# Patient Record
Sex: Male | Born: 1970 | Race: White | Hispanic: No | Marital: Married | Smoking: Former smoker
Health system: Southern US, Community
[De-identification: ages and names within clinical notes are randomized; demographics above are authoritative.]

## PROBLEM LIST (undated history)

## (undated) DIAGNOSIS — F410 Panic disorder [episodic paroxysmal anxiety] without agoraphobia: Secondary | ICD-10-CM

## (undated) DIAGNOSIS — C61 Malignant neoplasm of prostate: Secondary | ICD-10-CM

## (undated) DIAGNOSIS — K219 Gastro-esophageal reflux disease without esophagitis: Secondary | ICD-10-CM

## (undated) DIAGNOSIS — F419 Anxiety disorder, unspecified: Secondary | ICD-10-CM

## (undated) DIAGNOSIS — K759 Inflammatory liver disease, unspecified: Secondary | ICD-10-CM

## (undated) DIAGNOSIS — Z87442 Personal history of urinary calculi: Secondary | ICD-10-CM

## (undated) HISTORY — PX: PROSTATE BIOPSY: SHX241

## (undated) HISTORY — PX: LEG SURGERY: SHX1003

## (undated) HISTORY — PX: WISDOM TOOTH EXTRACTION: SHX21

---

## 2007-12-04 ENCOUNTER — Encounter: Admission: RE | Admit: 2007-12-04 | Discharge: 2007-12-04 | Payer: Self-pay | Admitting: Internal Medicine

## 2009-01-23 ENCOUNTER — Inpatient Hospital Stay (HOSPITAL_COMMUNITY): Admission: EM | Admit: 2009-01-23 | Discharge: 2009-01-26 | Payer: Self-pay | Admitting: Emergency Medicine

## 2009-02-18 ENCOUNTER — Encounter: Admission: RE | Admit: 2009-02-18 | Discharge: 2009-05-19 | Payer: Self-pay | Admitting: Orthopedic Surgery

## 2010-07-09 HISTORY — PX: FRACTURE SURGERY: SHX138

## 2010-10-15 LAB — BASIC METABOLIC PANEL
BUN: 15 mg/dL (ref 6–23)
BUN: 3 mg/dL — ABNORMAL LOW (ref 6–23)
Calcium: 8.3 mg/dL — ABNORMAL LOW (ref 8.4–10.5)
Calcium: 9.2 mg/dL (ref 8.4–10.5)
Chloride: 101 mEq/L (ref 96–112)
Chloride: 102 mEq/L (ref 96–112)
Chloride: 108 mEq/L (ref 96–112)
Creatinine, Ser: 0.66 mg/dL (ref 0.4–1.5)
GFR calc Af Amer: >60 mL/min (ref >60)
GFR calc Af Amer: >60 mL/min (ref >60)
GFR calc non Af Amer: >60 mL/min (ref >60)
GFR calc non Af Amer: >60 mL/min (ref >60)
Glucose, Bld: 109 mg/dL — ABNORMAL HIGH (ref 70–99)
Glucose, Bld: 114 mg/dL — ABNORMAL HIGH (ref 70–99)
Glucose, Bld: 95 mg/dL (ref 70–99)
Potassium: 3.6 mEq/L (ref 3.5–5.1)
Potassium: 3.7 mEq/L (ref 3.5–5.1)
Potassium: 3.9 mEq/L (ref 3.5–5.1)
Sodium: 134 mEq/L — ABNORMAL LOW (ref 135–145)
Sodium: 136 mEq/L (ref 135–145)

## 2010-10-15 LAB — DIFFERENTIAL
Basophils Relative: 1 % (ref 0–1)
Eosinophils Absolute: 0.4 10*3/uL (ref 0.0–0.7)
Monocytes Relative: 10 % (ref 3–12)
Neutro Abs: 5.5 10*3/uL (ref 1.7–7.7)
Neutrophils Relative %: 56 % (ref 43–77)

## 2010-10-15 LAB — PROTIME-INR
INR: 1 (ref 0.00–1.49)
Prothrombin Time: 12.9 seconds (ref 11.6–15.2)

## 2010-10-15 LAB — URINALYSIS, ROUTINE W REFLEX MICROSCOPIC
Bilirubin Urine: NEGATIVE
Glucose, UA: NEGATIVE mg/dL
Specific Gravity, Urine: 1.028 (ref 1.005–1.030)
pH: 5.5 (ref 5.0–8.0)

## 2010-10-15 LAB — CBC
HCT: 34.7 % — ABNORMAL LOW (ref 39.0–52.0)
HCT: 35.8 % — ABNORMAL LOW (ref 39.0–52.0)
Hemoglobin: 12.3 g/dL — ABNORMAL LOW (ref 13.0–17.0)
Hemoglobin: 13.1 g/dL (ref 13.0–17.0)
MCHC: 34.5 g/dL (ref 30.0–36.0)
MCV: 91.7 fL (ref 78.0–100.0)
MCV: 92.1 fL (ref 78.0–100.0)
MCV: 92.7 fL (ref 78.0–100.0)
Platelets: 192 10*3/uL (ref 150–400)
Platelets: 269 10*3/uL (ref 150–400)
RBC: 4.09 MIL/uL — ABNORMAL LOW (ref 4.22–5.81)
RBC: 4.93 MIL/uL (ref 4.22–5.81)
RDW: 12.9 % (ref 11.5–15.5)
RDW: 13.4 % (ref 11.5–15.5)
RDW: 13.5 % (ref 11.5–15.5)
WBC: 12.1 10*3/uL — ABNORMAL HIGH (ref 4.0–10.5)
WBC: 9.9 10*3/uL (ref 4.0–10.5)

## 2010-10-15 LAB — URINE MICROSCOPIC-ADD ON

## 2010-10-15 LAB — APTT: aPTT: 29 seconds (ref 24–37)

## 2010-11-21 NOTE — Discharge Summary (Signed)
NAMEABELINO, TIPPIN           ACCOUNT NO.:  [id number]   MEDICAL RECORD NO.:  [id number]          PATIENT TYPE:  INP   LOCATION:  5005                         FACILITY:  [facility]   PHYSICIAN:  Doralee Albino. Carola Frost, M.D. DATE OF BIRTH:  02/25/1971   DATE OF ADMISSION:  01/22/2009  DATE OF DISCHARGE:  01/26/2009                               DISCHARGE SUMMARY   DISCHARGE DIAGNOSES:  1. Right tibial shaft fracture, closed.  2. Weber B lateral malleolus fracture, closed.  3. Ruptured right syndesmosis.   ADDITIONAL DISCHARGE DIAGNOSIS:  Hyponatremia, resolved.   PROCEDURES PERFORMED ON January 23, 2009:  1. Intramedullary nailing of right tibia using DePuy 9 x 45 mm      statically locked nail.  2. Repair of right lateral malleolus fracture, open reduction and      internal fixation.  3. Repair of right syndesmotic disruption.  4. Stress x-rays evaluation under anesthesia of the syndesmosis for      external rotation stress view.   BRIEF HISTORY AND HOSPITAL COURSE:  Timothy [name] is a very pleasant 40-  year-old Caucasian male who was initially seen in the ED on January 23, 2009 after sustaining a severe injury to his right tibia fibula while  ice-skating.  The patient had immediate onset of pain and deformity and  was brought to [facility] for evaluation.  The evaluation  determined that he had multiple fractures of his right tibial shaft as  well as a right fibula fracture closed proximity to the lateral  malleolus with questionable injury to the syndesmosis.  The patient was  seen and evaluated in the emergency room by Orthopedics where it was  determined that he would indeed need a surgical intervention for his  injuries.  The patient was brought to the operating room same day for  surgery, which he tolerated very well.  In addition to fixation of the  tibial shaft, we did perform stress view of the right ankle after  completion of the IM nail, portion of the procedure,  and it was  determined that he did have an unstable syndesmosis, which required  fixation of the lateral malleolus as well as syndesmosis.  After  surgery, the patient was brought to the PACU for brief recovery from  anesthesia and was then transported to the Orthopedic floor for  continued observation and pain control.  Mr. [facility] hospital stay  was relatively uncomplicated.  Postoperative check was unremarkable.  He  did not exhibit any signs or symptoms of evolving compartment syndrome,  had excellent motor and sensory function at his right lower extremity as  well.  On postoperative day #1, the patient was doing very well and pain  was controlled on PCA pump.  He had a decreased appetite for solid food,  but was taking in fluids very well, voiding without any difficulty and  denied any numbness or tingling.  No other complaints were noted.  His  vital signs and labs were unremarkable.  Physical exam was unremarkable  as well.  Particularly at the right lower extremity, his splint was  intact.  Minimal edema of  the right leg.  Toes was intact.  No pain with  passive stretch or movement.  Sensory functions were intact of the right  lower extremity as well.  The patient continued to progress with  physical therapy, was mobilizing very well utilizing a walker.  We did  plan on discontinuing his PCA in the afternoon with hopeful discontinue  at home on the following day.  On postoperative day #1, the patient was  also started on Lovenox for DVT prophylaxis.  On postop day #2, the  patient stated that he did have a rougher night after his PCA was  discontinued and requested to stay another day to obtain better control  over his pain.  In addition, he had not navigate his stairs with  physical therapy and this was thought to be a reasonable request.  We  felt the patient was not ready for seek discharge to home.  No other  complaints were noted.  Vital signs continued to remained  stable.  He  did experience a mild drop of sodium to 134, H and H continued to remain  stable as well.  The patient physical exam was unremarkable as well,  need rest.  The dressing of his right knee was changed.  His incision  looked great.  We did maintain splint to the right ankle.  This will be  changed on follow up to the office.  Again, no pain with passive  stretch.  Sensory functions were intact distally.  Extremity was warm as  well.  The patient did mobilize very well with physical therapy on  postoperative day #2 and successfully navigate his stairs.  On postop  day #3, the patient was deemed stable for discharge to home without any  additional concerns.  Clinical encounter postop day #3, subjective,  objective, the patient is doing well and looked great.  He is ready to  go home.   PHYSICAL EXAMINATION:  VITAL SIGNS:  Temperature 99.4, heart rate 89,  respirations 18 and 96% on room air, BP is 111/60.  GENERAL:  The patient is comfortable in no acute distress.  LUNGS:  Clear to auscultation bilaterally.  CARDIAC:  S1 and S2 were noted.  ABDOMEN:  Positive bowel sounds, nontender and soft.  EXTREMITY:  Right lower extremity dressing is clean, dry, and intact.  Deep peroneal nerve, superficial peroneal nerve, and tibial nerve  sensory functions are intact.  EHL, FHL, motor functions are intact.  No  pain with passive stretch.  The patient is able to actively move his  digits and knee as well.  Extremity is warm.   Hemoglobin 11.9, hematocrit 34.7, white blood cells 10.9, platelets 192.  Sodium 139, potassium 3.9, chloride 102, bicarb 30, BUN 3, creatinine  0.64, glucose 95.   ASSESSMENT AND PLAN:  A 40 year old male with status post intramedullary  nail right hip shaft fracture and open reduction and internal fixation  of right lateral malleolus and syndesmosis.  1. Right tibia fibula fracture, ankle fractures, status post fixation.      The patient will continue to be  nonweightbearing on his right lower      extremity, should continue with PT and OT.  We will continue with      the splint until first office followup.  Continue with ice and      elevation help with swelling.  2. Pain.  Will continue with p.o. medication.  3. Fluids, electrolytes, and nutrition.  Continue with regular diet.  4. Deep venous  thrombosis prophylaxis.  Continue Lovenox x14 more      days.   DISPOSITION:  Discharge the patient to home today with home health  physical therapy.   DISCHARGE MEDICATIONS:  1. Percocet 10/325 one p.o. q.4-6 h. as needed for pain.  2. Oxycodone 5 mg one to two p.o. q.3 h. as needed for breakthrough      pain.  3. Robaxin 500 mg one to two p.o. q. 6 h. as needed.  4. Lovenox 40 mg one subcutaneous injection daily x14 more days.  The      patient is encouraged to use over-the-counter stool softeners as      needed.   ADDITIONAL MEDICATIONS:  The patient is on no home medications.   DISCHARGE INSTRUCTIONS AND PLAN:  Mr. Tavis did sustain a  substantial injury to his right lower extremity including in his tibia  and ankle despite the relatively low energy imparted to treat his  injury.  Mr. Laraia has done very well in the hospital, has  mobilized extraordinarily well with physical therapy and appears to be  fairly independent with his activities.  These all bowed well for a good  recovery as well.  Major concern regarding Mr. Granada history of  cigarette use and tobacco use.  As of at this point, he has not had a  cigarette since his injury and has had not desired in light of the  cigarette at this time.  We did review the various effects that nicotine  has on wound healing and the patient is well aware of these issues, in  case that he will refrain from use of any nicotine-containing products.  Mr. Villamor would be nonweightbearing for the next 8 weeks graduated  weightbearing thereafter.  We do anticipate seeing Mr.  Chew back  in the next 10-14 days, at which time, we will remove his splint and  allow him unrestricted range of motion of his ankle primarily in the  sagittal plane for the first 4-6 weeks.  Once complete healing of the  lateral malleolus is noted, we then will begin motion in the sagittal  plane.  Upon follow up, Mr. Weaver is encouraged to bring his CAM  boot as we will transition him into this once we removed the splint;  however, he will continue to be nonweightbearing until we have reached 8-  week mark postoperatively.  Mr. Kadlec is at risk for the  development of a DVT.  He will be on Lovenox for the next 14 days to be  help decrease his risk of the development in the blood clots.  I am hold  for that his activity level.  In the hospital, thus far it will be  indicative of what he does once he returned home and should mobilize  very well and be fairly active while maintaining nonweightbearing on his  right lower extremity.  Therefore, he should not require any additional  pharmacologic DVT prophylaxis; however, again his smoking history does  place somewhat at increased risk for clot development.  Again, we will  see Mr. Rauch back in the next 10-14 days for reevaluation, at  which time, we will assess his progress, obtain x-rays of his right  tibia and ankle as well as I will remove the sutures from his operative  wound.  Should the patient have any questions prior to followup, he was  encouraged to contact the office.      Mearl Latin, PA  Doralee Albino. Carola Frost, M.D.  Electronically Signed    KWP/MEDQ  D:  01/26/2009  T:  01/26/2009  Job:  [id number]

## 2010-11-21 NOTE — Op Note (Signed)
[name], Timothy [name]           ACCOUNT NO.:  [id number]   MEDICAL RECORD NO.:  [id number]          PATIENT TYPE:  INP   LOCATION:  5005                         FACILITY:  [facility]   PHYSICIAN:  Doralee Albino. Carola Frost, M.D. DATE OF BIRTH:  August 03, 1970   DATE OF PROCEDURE:  01/23/2009  DATE OF DISCHARGE:                               OPERATIVE REPORT   PREOPERATIVE DIAGNOSIS:  Right tibia and fibula fracture.   POSTOPERATIVE DIAGNOSES:  1. Right tibial shaft fracture.  2. Weber B lateral malleolus fracture.  3. Ruptured syndesmosis.   PROCEDURES:  1. Intramedullary nailing of the right tibia using a DePuy 9 x 345 mm      statically locked nail.  2. Repair of lateral malleolus fracture.  3. Repair of syndesmotic disruption.  4. Stress x-ray evaluation of the syndesmosis for external rotation      stress view.   SURGEON:  Doralee Albino. Carola Frost, MD   ASSISTANT:  Mearl Latin, PA   FINDINGS:  Syndesmotic instability with medial space widening.   ESTIMATED BLOOD LOSS:  100 mL or less.   DISPOSITION:  To PACU.   CONDITION:  Stable.   BRIEF SUMMARY OF INDICATIONS FOR PROCEDURE:  Timothy [name] is a 40-  year-old male sustained an ice-skating injury resulting in fracture to  the right tibia as well as a more distal fibula fracture.  He was  admitted for pain, taken to the operating room several hours later for  intramedullary nailing.  I discussed with the patient the risks and  benefits of surgery including the possibility of anterior knee pain,  symptomatic hardware, infection, malunion, nonunion, need for further  surgery, DVT, PE, loss of motion, infection, neurovascular injury,  compartment syndrome, and multiple others.  After full discussion, he  did wish to proceed.   BRIEF DESCRIPTION OF PROCEDURE:  Timothy [name] was taken to the  operating room where general anesthesia was induced.  His right hip was  bumped up, but the patient has bilateral external torsion of his  tibiae  and that we did intend to match the rotation being careful not to  internally rotate him because of his particular anatomy.  A 2-cm  incision was made at the distal extent of the patella with the knee  flexed.  A medial parapatellar approach was then made, a curved  cannulated awl placed on the correct starting point just medial to the  lateral tibial spine, and then advanced in the center-center position of  the proximal tibia.  Guidewire was then placed and advanced across the  fracture site into the center-center position of the tibial plafond.  I  took several reduction maneuvers for the displaced shaft, but I had  difficulty obtaining an anatomic reduction with initial maneuvers and  this included percutaneous placement of a tenaculum.  This was then  followed by gentle rotation of the tibia to allow the anterior cortex to  be below the proximal extent of the fracture, applied posterior force  there, and then de-rotated the tibia resulting in restoration of an  anatomic alignment.  This was checked on both AP and lateral images,  not  required any opening of the fracture site itself.  The fracture was held  reduced while reaming.  We encountered chatter at 8 and placed a 9 mm x  345 nail.  Two distal locks were placed using perfect circle technique.  Nail was gently back slapped to get complete apposition and  interdigitation of the fracture, which confirmed appropriate reduction  and rotation.  Two proximal static locks were then placed.  Their  position within the nail was confirmed on multiple C-arm images.  We  then irrigated and were beginning to close then, performed an external  rotation stress view under flow to make sure that there was no  syndesmotic stability and that separate fixation of the ankle fracture  components was not required.   On this stress ER view, clear medial widening was appreciable at the  within the tibiotalar joint.  This was consistent in  diagnostic of  syndesmotic instability.  I then attempted to percutaneously reduce the  syndesmosis by using a large pointed tenaculum through a stab incision  laterally and within the head of the screw or locking both medially.  This resulted in restoration of the syndesmosis but produced significant  displacement at the lateral malleolus, which was unacceptable because of  symptomatic nonunion risk that close to the ankle joint.  Consequently,  decision was made to proceed with a formal ORIF of the lateral malleolus  coupled with fixation of a reduced mortise and syndesmosis.   Standard lateral approach was made carefully looking for the superficial  peroneal nerve.  Soft tissues were retracted anteriorly.  The fracture  site was exposed while maintaining as much periosteal attachment as  possible.  The fibula appeared to be rotated appropriately, but we had  some medial-to-lateral translation which was difficult to control.  We  were able to do it with pointed tenaculums placing an anterior-posterior  lag screw.  This was followed by application of a 6-hole neutralization  plate using the DePuy small frag titanium implants.  Two screws were  placed distally, 2 in excellent cortical bone proximally, and then a  syndesmotic screw inferior to the nail across the plafond angle slightly  10-15 degrees anteriorly within the tibia.  The syndesmosis was held  reduced during placement.  External rotation stress view after placement  of this demonstrated no displacement or widening.  Wound was copiously  irrigated and closed in standard layered fashion with 2-0 Vicryl and 3-0  nylon.  Montez Morita, PA-C, assisted me throughout the procedures and was  required for all portions of the case including the nailing during which  he remained and place the implant while I held reduction, the external  rotation stress view during which he maintained the proximal tibia, and  also the fixation of the lateral  malleolus and syndesmosis during which  he held the reduction while I instrumented.  He also assisted with  simultaneous wound closure.   PROGNOSIS:  Timothy [name] has had a fairly substantial injury to his  tibia and ankle despite the relatively low magnitude of injury.  As  such, I am concerned about the possibility of a subsequent compartment  syndrome and he will be watched carefully over the next 24 hours to make  sure one does not develop.  He will be nonweightbearing for 8 weeks  approximately with gradual weightbearing thereafter.  He will be on DVT  prophylaxis with Lovenox.  I have unrestricted range of motion of the  knee once his wound  is healed.  We anticipate converting him into a Cam  boot and allowing for a ROM and PROM of the ankle as well.      Doralee Albino. Carola Frost, M.D.  Electronically Signed     MHH/MEDQ  D:  01/23/2009  T:  01/23/2009  Job:  [id number]

## 2011-08-29 DIAGNOSIS — F419 Anxiety disorder, unspecified: Secondary | ICD-10-CM | POA: Insufficient documentation

## 2015-10-25 ENCOUNTER — Emergency Department (HOSPITAL_BASED_OUTPATIENT_CLINIC_OR_DEPARTMENT_OTHER)
Admission: EM | Admit: 2015-10-25 | Discharge: 2015-10-25 | Disposition: A | Discharge status: Home / Self Care | Payer: BLUE CROSS/BLUE SHIELD | Attending: Emergency Medicine | Admitting: Emergency Medicine

## 2015-10-25 ENCOUNTER — Encounter (HOSPITAL_BASED_OUTPATIENT_CLINIC_OR_DEPARTMENT_OTHER): Payer: Self-pay | Admitting: Emergency Medicine

## 2015-10-25 ENCOUNTER — Emergency Department (HOSPITAL_BASED_OUTPATIENT_CLINIC_OR_DEPARTMENT_OTHER): Payer: BLUE CROSS/BLUE SHIELD

## 2015-10-25 DIAGNOSIS — R079 Chest pain, unspecified: Secondary | ICD-10-CM

## 2015-10-25 DIAGNOSIS — Z79899 Other long term (current) drug therapy: Secondary | ICD-10-CM | POA: Diagnosis not present

## 2015-10-25 DIAGNOSIS — Z8659 Personal history of other mental and behavioral disorders: Secondary | ICD-10-CM | POA: Insufficient documentation

## 2015-10-25 DIAGNOSIS — R0789 Other chest pain: Secondary | ICD-10-CM

## 2015-10-25 HISTORY — DX: Anxiety disorder, unspecified: F41.9

## 2015-10-25 HISTORY — DX: Panic disorder (episodic paroxysmal anxiety): F41.0

## 2015-10-25 LAB — CBC
HEMATOCRIT: 46.3 % (ref 39.0–52.0)
HEMOGLOBIN: 16.1 g/dL (ref 13.0–17.0)
MCH: 31.2 pg (ref 26.0–34.0)
MCHC: 34.8 g/dL (ref 30.0–36.0)
MCV: 89.7 fL (ref 78.0–100.0)
Platelets: 257 10*3/uL (ref 150–400)
RBC: 5.16 MIL/uL (ref 4.22–5.81)
RDW: 12.5 % (ref 11.5–15.5)
WBC: 6.8 10*3/uL (ref 4.0–10.5)

## 2015-10-25 LAB — LIPASE, BLOOD: Lipase: 16 U/L (ref 11–51)

## 2015-10-25 LAB — D-DIMER, QUANTITATIVE (NOT AT ARMC): D DIMER QUANT: 0.48 ug{FEU}/mL (ref 0.00–0.50)

## 2015-10-25 LAB — BASIC METABOLIC PANEL
ANION GAP: 7 (ref 5–15)
BUN: 13 mg/dL (ref 6–20)
CHLORIDE: 111 mmol/L (ref 101–111)
CO2: 22 mmol/L (ref 22–32)
Calcium: 9 mg/dL (ref 8.9–10.3)
Creatinine, Ser: 0.67 mg/dL (ref 0.61–1.24)
GFR calc Af Amer: >60 mL/min (ref >60)
GLUCOSE: 119 mg/dL — AB (ref 65–99)
POTASSIUM: 3.3 mmol/L — AB (ref 3.5–5.1)
Sodium: 140 mmol/L (ref 135–145)

## 2015-10-25 LAB — TROPONIN I: Troponin I: <0.03 ng/mL (ref <0.031)

## 2015-10-25 MED ORDER — ASPIRIN 81 MG PO CHEW
324.0000 mg | CHEWABLE_TABLET | Freq: Once | ORAL | Status: AC
  Administered 2015-10-25: 324 mg via ORAL
  Filled 2015-10-25: qty 4

## 2015-10-25 MED ORDER — GI COCKTAIL ~~LOC~~
30.0000 mL | Freq: Once | ORAL | Status: AC
  Administered 2015-10-25: 30 mL via ORAL
  Filled 2015-10-25: qty 30

## 2015-10-25 MED ORDER — FAMOTIDINE 20 MG PO TABS
20.0000 mg | ORAL_TABLET | Freq: Two times a day (BID) | ORAL | Status: DC
Start: 2015-10-25 — End: 2020-09-02

## 2015-10-25 MED FILL — HEARTBURN RELIEF TABLET: 10 | 15 days supply | Qty: 60 | Fill #0

## 2015-10-25 NOTE — ED Provider Notes (Signed)
CSN: [id number]     Arrival date & time 10/25/15  [id number] History   First MD Initiated Contact with Patient 10/25/15 1006     Chief Complaint  Patient presents with  . Chest Pain     (Consider location/radiation/quality/duration/timing/severity/associated sxs/prior Treatment) HPI Comments: PT NOTICED CP INITIALLY ON 4/14.  IT WAS SHARP AND BRIEF.  IT CAME BACK BRIEFLY AGAIN YESTERDAY AND TODAY.  PT SAID THAT HE WAS VERY ACTIVE THIS WEEKEND AND EXERCISE DID NOT WORSEN SX.  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  Patient is a 45 y.o. male presenting with chest pain. The history is provided by the patient.  Chest Pain Pain location:  Substernal area Pain quality: dull   Pain radiates to:  Does not radiate Pain radiates to the back: no   Pain severity:  Mild Onset quality:  Sudden Timing:  Intermittent Progression:  Waxing and waning Chronicity:  Recurrent Context: at rest   Relieved by:  None tried Worsened by:  Nothing tried Ineffective treatments:  None tried Risk factors: male sex and smoking     Past Medical History  Diagnosis Date  . Anxiety   . Panic attack    Past Surgical History  Procedure Laterality Date  . Leg surgery     No family history on file.  PT IS ADOPTED.  SOC HX:  PT QUIT SMOKING 3 YEARS AGO.  HE DRINKS OCC.  NO DOA.  Review of Systems  Cardiovascular: Positive for chest pain.  All other systems reviewed and are negative.     Allergies  Percocet  Home Medications   Prior to Admission medications   Medication Sig Start Date End Date Taking? Authorizing Provider  Multiple Vitamin (MULTIVITAMIN) capsule Take 1 capsule by mouth daily.   Yes Historical Provider, MD   BP 165/99 mmHg  Pulse 96  Temp(Src) 98.3 F (36.8 C) (Oral)  Resp 20  Ht 5\' 7"  (1.702 m)  Wt 220 lb (99.791 kg)  BMI 34.45 kg/m2  SpO2 100% Physical Exam  ED Course  Procedures (including critical care time) Labs Review Labs Reviewed  BASIC METABOLIC PANEL   CBC  TROPONIN I  D-DIMER, QUANTITATIVE (NOT AT [facility])  LIPASE, BLOOD    Imaging Review No results found. I have personally reviewed and evaluated these images and lab results as part of my medical decision-making.   EKG Interpretation None    NL SINUS RHYTHM HR 94.  NO ST/T WAVE CHANGES.  NO STEMI.  MDM  Pt has minimal risk for cad.  I think he can go home and return if worse.  dx:  Nonspecific chest pain    Isla Pence, MD 10/25/15 1125

## 2015-10-25 NOTE — ED Notes (Signed)
Intermittent centralized chest pain since last Thursday.  Some diaphoresis.  Pt also has some anxiety.

## 2015-11-11 DIAGNOSIS — K219 Gastro-esophageal reflux disease without esophagitis: Secondary | ICD-10-CM | POA: Insufficient documentation

## 2016-01-06 DIAGNOSIS — F32A Depression, unspecified: Secondary | ICD-10-CM | POA: Insufficient documentation

## 2016-01-20 DIAGNOSIS — G4700 Insomnia, unspecified: Secondary | ICD-10-CM | POA: Insufficient documentation

## 2017-07-19 DIAGNOSIS — E669 Obesity, unspecified: Secondary | ICD-10-CM | POA: Insufficient documentation

## 2020-05-13 DIAGNOSIS — C61 Malignant neoplasm of prostate: Secondary | ICD-10-CM | POA: Insufficient documentation

## 2020-08-08 ENCOUNTER — Other Ambulatory Visit: Payer: Self-pay | Admitting: Urology

## 2020-08-08 DIAGNOSIS — C61 Malignant neoplasm of prostate: Secondary | ICD-10-CM

## 2020-08-17 ENCOUNTER — Encounter (HOSPITAL_COMMUNITY): Payer: Self-pay

## 2020-08-17 ENCOUNTER — Encounter (HOSPITAL_COMMUNITY): Payer: 59

## 2020-08-17 ENCOUNTER — Encounter (HOSPITAL_COMMUNITY): Admission: RE | Admit: 2020-08-17 | Payer: 59 | Source: Ambulatory Visit

## 2020-08-17 ENCOUNTER — Ambulatory Visit (HOSPITAL_COMMUNITY): Payer: 59

## 2020-08-24 ENCOUNTER — Other Ambulatory Visit (HOSPITAL_COMMUNITY): Payer: 59

## 2020-08-26 ENCOUNTER — Encounter (HOSPITAL_COMMUNITY)
Admission: RE | Admit: 2020-08-26 | Discharge: 2020-08-26 | Disposition: A | Discharge status: Home / Self Care | Payer: 59 | Source: Ambulatory Visit | Attending: Urology | Admitting: Urology

## 2020-08-26 ENCOUNTER — Other Ambulatory Visit: Payer: Self-pay

## 2020-08-26 DIAGNOSIS — C61 Malignant neoplasm of prostate: Secondary | ICD-10-CM | POA: Insufficient documentation

## 2020-08-26 MED ORDER — TECHNETIUM TC 99M MEDRONATE IV KIT
20.0000 | PACK | Freq: Once | INTRAVENOUS | Status: AC | PRN
  Administered 2020-08-26: 20.9 via INTRAVENOUS

## 2020-08-30 ENCOUNTER — Encounter: Payer: Self-pay | Admitting: Radiation Oncology

## 2020-08-30 NOTE — Progress Notes (Signed)
GU Location of Tumor / Histology: prostatic adenocarcinoma  If Prostate Cancer, Gleason Score is (4 + 4) and PSA is (5.1). Prostate volume: 17 grams  Timothy [name] presented with an elevated PSA after initial PSA check. Patient explains he had a routine physical on January 28,2021. Bloodwork from this physical revealed an elevated PSA. Unfortunately, repeat PSA 3 months later was also elevated thus patient was referred to Dr. Abner Greenspan.  Biopsies of prostate (if applicable) revealed:   Past/Anticipated interventions by urology, if any: prostate biopsy, CT abd/pelvis, bone scan (negative), referral to Dr. Tammi Klippel to discuss radiation options.  Patient leaning toward RALP.  Past/Anticipated interventions by medical oncology, if any: no  Weight changes, if any: denies  Bowel/Bladder complaints, if any: IPSS 2 SHIM 25. Denies dysuria, hematuria, urinary leakage or incontinence. Denies any bowel complaints.   Nausea/Vomiting, if any: denies  Pain issues, if any:  Chronic low back pain right side worse than left. Prolonged standing creates tingling in his right foot.  SAFETY ISSUES:  Prior radiation? denies  Pacemaker/ICD? denies  Possible current pregnancy? no, male patient  Is the patient on methotrexate? no  Current Complaints / other details:  50 year old male. Married with 1 son. Resides in [place]. Patient adopted.

## 2020-09-02 ENCOUNTER — Encounter: Payer: Self-pay | Admitting: Medical Oncology

## 2020-09-02 ENCOUNTER — Ambulatory Visit
Admission: RE | Admit: 2020-09-02 | Discharge: 2020-09-02 | Disposition: A | Discharge status: Home / Self Care | Payer: 59 | Source: Ambulatory Visit | Attending: Radiation Oncology | Admitting: Radiation Oncology

## 2020-09-02 ENCOUNTER — Encounter: Payer: Self-pay | Admitting: Radiation Oncology

## 2020-09-02 ENCOUNTER — Other Ambulatory Visit: Payer: Self-pay

## 2020-09-02 VITALS — BP 118/66 | HR 73 | Temp 97.6°F | Resp 20 | Ht 66.0 in | Wt 214.4 lb

## 2020-09-02 DIAGNOSIS — Z79899 Other long term (current) drug therapy: Secondary | ICD-10-CM | POA: Insufficient documentation

## 2020-09-02 DIAGNOSIS — C61 Malignant neoplasm of prostate: Secondary | ICD-10-CM

## 2020-09-02 DIAGNOSIS — F419 Anxiety disorder, unspecified: Secondary | ICD-10-CM | POA: Insufficient documentation

## 2020-09-02 HISTORY — DX: Malignant neoplasm of prostate: C61

## 2020-09-02 NOTE — Progress Notes (Signed)
Radiation Oncology         [phone] ________________________________  Initial outpatient Consultation  Name: Timothy [name] MRN: [id number]  Date: 09/02/2020  DOB: 05/08/1971  CC:Loyola Mast, PA-C  Janith Lima, MD   REFERRING PHYSICIAN: Janith Lima, MD  DIAGNOSIS: 50 y.o. gentleman with Stage T1c adenocarcinoma of the prostate with Gleason score of 4+4, and PSA of 5.1.    ICD-10-CM   1. Malignant neoplasm of prostate [facility])  [place] Ambulatory Referral to Genetics    HISTORY OF PRESENT ILLNESS: Timothy [name] is a 50 y.o. male with a diagnosis of prostate cancer. He was noted to have an elevated PSA of 4.8 in 01/2020 by his primary care provider, Daisy Lazar, PA-C.  A repeat PSA approximately 3 months later, on 04/08/2020, was further elevated at 5.1.  Accordingly, he was referred for evaluation in urology by Dr. Abner Greenspan on 06/10/20,  digital rectal examination was performed at that time revealing no nodules.  The patient proceeded to transrectal ultrasound with 12 biopsies of the prostate on 07/28/20.  The prostate volume measured 17 cc.  Out of 12 core biopsies, 7 were positive.  The maximum Gleason score was 4+4, and this was seen in the right mid lateral, right base lateral, right mid (with PNI). Additionally, Gleason 4+3 was seen in the right base, left mid, and left mid lateral, and Gleason 3+4 in left base.  He proceeded to disease staging imaging with CT and bone scan on 08/26/2020. CT A/P showed minimal heterogeneity of the prostate gland without evidence of extracapsular tumor or metastatic disease. Bone scan was also negative for osseous metastatic disease.  The patient reviewed the biopsy results with his urologist and he has kindly been referred today for discussion of potential radiation treatment options.  PREVIOUS RADIATION THERAPY: No  PAST MEDICAL HISTORY:  Past Medical History:  Diagnosis Date  . Anxiety   . Panic attack   . Prostate cancer  ([place])       PAST SURGICAL HISTORY: Past Surgical History:  Procedure Laterality Date  . LEG SURGERY    . PROSTATE BIOPSY      FAMILY HISTORY:  Family History  Adopted: Yes  Problem Relation Age of Onset  . Breast cancer Neg Hx   . Colon cancer Neg Hx   . Pancreatic cancer Neg Hx   . Prostate cancer Neg Hx     SOCIAL HISTORY:  Social History   Socioeconomic History  . Marital status: Married    Spouse name: Not on file  . Number of children: Not on file  . Years of education: Not on file  . Highest education level: Not on file  Occupational History  . Not on file  Tobacco Use  . Smoking status: Never Smoker  . Smokeless tobacco: Never Used  Vaping Use  . Vaping Use: Never used  Substance and Sexual Activity  . Alcohol use: Not Currently  . Drug use: Never  . Sexual activity: Yes  Other Topics Concern  . Not on file  Social History Narrative  . Not on file   Social Determinants of Health   Financial Resource Strain: Not on file  Food Insecurity: Not on file  Transportation Needs: Not on file  Physical Activity: Not on file  Stress: Not on file  Social Connections: Not on file  Intimate Partner Violence: Not on file    ALLERGIES: Other, Percocet [oxycodone-acetaminophen], Tetanus toxoids, and Shellfish-derived products  MEDICATIONS:  Current Outpatient Medications  Medication Sig Dispense Refill  . busPIRone (BUSPAR) 10 MG tablet Take 10 mg by mouth 2 (two) times daily.    [name] escitalopram (LEXAPRO) 20 MG tablet Take 20 mg by mouth daily.    . Multiple Vitamin (MULTIVITAMIN) capsule Take 1 capsule by mouth daily.     No current facility-administered medications for this encounter.    REVIEW OF SYSTEMS:  On review of systems, the patient reports that he is doing well overall. He denies any chest pain, shortness of breath, cough, fevers, chills, night sweats, unintended weight changes. He denies any bowel disturbances, and denies abdominal pain, nausea or  vomiting. He denies any new musculoskeletal or joint aches or pains. His IPSS was 2, indicating mild urinary symptoms. His SHIM was 25, indicating he does not have erectile dysfunction. A complete review of systems is obtained and is otherwise negative.    PHYSICAL EXAM:  Wt Readings from Last 3 Encounters:  09/02/20 214 lb 6.4 oz (97.3 kg)  10/25/15 220 lb (99.8 kg)   Temp Readings from Last 3 Encounters:  09/02/20 97.6 F (36.4 C)  10/25/15 98.3 F (36.8 C) (Oral)   BP Readings from Last 3 Encounters:  09/02/20 118/66  10/25/15 115/69   Pulse Readings from Last 3 Encounters:  09/02/20 73  10/25/15 72   Pain Assessment Pain Score: 0-No pain (Denies new pain. Reports chronic low back pain right worse than left.)/10  In general this is a well appearing Caucasian male in no acute distress. He's alert and oriented x4 and appropriate throughout the examination. Cardiopulmonary assessment is negative for acute distress, and he exhibits normal effort.     KPS = 100  100 - Normal; no complaints; no evidence of disease. 90   - Able to carry on normal activity; minor signs or symptoms of disease. 80   - Normal activity with effort; some signs or symptoms of disease. 64   - Cares for self; unable to carry on normal activity or to do active work. 60   - Requires occasional assistance, but is able to care for most of his personal needs. 50   - Requires considerable assistance and frequent medical care. 37   - Disabled; requires special care and assistance. 2   - Severely disabled; hospital admission is indicated although death not imminent. 27   - Very sick; hospital admission necessary; active supportive treatment necessary. 10   - Moribund; fatal processes progressing rapidly. 0     - Dead  Karnofsky DA, Abelmann Stem, Craver LS and Burchenal [facility] [phone]) The use of the nitrogen mustards in the palliative treatment of carcinoma: with particular reference to bronchogenic carcinoma Cancer 1  634-56  LABORATORY DATA:  Lab Results  Component Value Date   WBC 6.8 10/25/2015   HGB 16.1 10/25/2015   HCT 46.3 10/25/2015   MCV 89.7 10/25/2015   PLT 257 10/25/2015   Lab Results  Component Value Date   NA 140 10/25/2015   K 3.3 (L) 10/25/2015   CL 111 10/25/2015   CO2 22 10/25/2015   No results found for: ALT, AST, GGT, ALKPHOS, BILITOT   RADIOGRAPHY: NM Bone Scan Whole Body  Result Date: 08/28/2020 CLINICAL DATA:  Prostate cancer. EXAM: NUCLEAR MEDICINE WHOLE BODY BONE SCAN TECHNIQUE: Whole body anterior and posterior images were obtained approximately 3 hours after intravenous injection of radiopharmaceutical. RADIOPHARMACEUTICALS:  20.9 mCi Technetium-34m MDP IV COMPARISON:  Abdominopelvic CT 08/17/2020 FINDINGS: Physiologic distribution of radiotracer uptake throughout the axial and appendicular skeleton.  There is mild degenerative uptake at the lumbosacral junction. Degenerative changes are seen on CT. No focal uptake to suggest osseous metastatic disease. Both kidneys and bladder are visualized. IMPRESSION: No scintigraphic findings to suggest osseous metastatic disease. Electronically Signed   By: Keith Rake M.D.   On: 08/28/2020 10:32      IMPRESSION/PLAN: 1. 50 y.o. gentleman with Stage T1c adenocarcinoma of the prostate with Gleason Score of 4+4, and PSA of 5.1. We discussed the patient's workup and outlined the nature of prostate cancer in this setting. The patient's T stage, Gleason's score, and PSA put him into the high risk group. Accordingly, he is eligible for a variety of potential treatment options including prostatectomy or LT-ADT in combination with either 8 weeks of external radiation or 5 weeks of external radiation with an upfront brachytherapy boost. We discussed the available radiation techniques, and focused on the details and logistics of delivery. We discussed and outlined the risks, benefits, short and long-term effects associated with radiotherapy  and compared and contrasted these with prostatectomy. We discussed the role of SpaceOAR gel in reducing the rectal toxicity associated with radiotherapy. We also detailed the role of ADT in the treatment of high risk prostate cancer and outlined the associated side effects that could be expected with this therapy.  He and his wife were encouraged to ask questions are answered to their stated satisfaction.  The patient focused most of his questions and interest in robotic-assisted laparoscopic radical prostatectomy.  We discussed some of the potential advantages of surgery including surgical staging, the availability of salvage radiotherapy to the prostatic fossa, and the confidence associated with immediate biochemical response. We discussed some of the potential proven indications for postoperative radiotherapy including positive margins, extracapsular extension, and seminal vesicle involvement. We also talked about some of the other potential findings leading to a recommendation for radiotherapy including a non-zero postoperative PSA and positive lymph nodes. He appears to have a good understanding of his disease and our treatment recommendations which are of curative intent.   At the conclusion of our conversation, the patient is interested in moving forward with prostatectomy. We will share our discussion with Dr. Abner Greenspan so that he can proceed with coordinating surgery. We enjoyed meeting him and his wife today and look forward to following along in his progress.  2. High Risk prostate cancer diagnosed at young age.  The patient was counseled regarding the possibility of a familial predisposition for cancers though he does not know anything about his natural parents medical history since he is adopted.  After further discussion regarding the potential benefits to himself, his children and future generations, he is interested in proceeding with genetic counseling/testing. A referral will be made to one of our  genetic counselors here in the cancer center for further assessment.    Nicholos Johns, PA-C    Tyler Pita, MD  [facility] Direct Dial: [phone]  Fax: [phone] .com  Skype  LinkedIn   This document serves as a record of services personally performed by Tyler Pita, MD and Freeman Caldron, PA-C. It was created on their behalf by Wilburn Mylar, a trained medical scribe. The creation of this record is based on the scribe's personal observations and the provider's statements to them. This document has been checked and approved by the attending provider.

## 2020-09-02 NOTE — Progress Notes (Signed)
Introduced myself to patient and his wife as the prostate nurse navigator and discussed my role. He states he is most interest in surgery due to his age. He is adopted with no family history. He is interested in genetic testing for his son's sake. No barriers to care at this time. He has follow up with Dr. Abner Greenspan 3/17 to discuss surgery further. I gave them my business card and asked them to call if I can assist them in any way. They voiced understanding and I wished him well.

## 2020-09-05 ENCOUNTER — Telehealth: Payer: Self-pay | Admitting: Genetic Counselor

## 2020-09-05 NOTE — Telephone Encounter (Signed)
Received a genetic counseling referral from radonc for prostate cancer. Timothy [name] has been cld and scheduled to see Santiago Glad for an in person visit on 3/7 at [place].

## 2020-09-08 ENCOUNTER — Other Ambulatory Visit: Payer: Self-pay | Admitting: Urology

## 2020-09-09 ENCOUNTER — Other Ambulatory Visit: Payer: Self-pay | Admitting: Licensed Clinical Social Worker

## 2020-09-09 DIAGNOSIS — C61 Malignant neoplasm of prostate: Secondary | ICD-10-CM

## 2020-09-12 ENCOUNTER — Other Ambulatory Visit: Payer: Self-pay

## 2020-09-12 ENCOUNTER — Inpatient Hospital Stay: Payer: 59 | Attending: Genetic Counselor | Admitting: Licensed Clinical Social Worker

## 2020-09-12 ENCOUNTER — Inpatient Hospital Stay: Payer: 59

## 2020-09-12 DIAGNOSIS — C61 Malignant neoplasm of prostate: Secondary | ICD-10-CM

## 2020-09-12 LAB — GENETIC SCREENING ORDER

## 2020-09-12 NOTE — Progress Notes (Signed)
REFERRING PROVIDER: Freeman Caldron, PA-C [place],  [place] [address]  PRIMARY PROVIDER:  Loyola Mast, PA-C  PRIMARY REASON FOR VISIT:  1. Malignant neoplasm of prostate ([place])      HISTORY OF PRESENT ILLNESS:   Mr. Nay, a 50 y.o. male, was seen for a [facility] cancer genetics consultation at the request of Dr. Vincent Gros due to a personal history of cancer and unknown family history.  Mr. Purohit presents to clinic today to discuss the possibility of a hereditary predisposition to cancer, genetic testing, and to further clarify his future cancer risks, as well as potential cancer risks for family members.   In 2022, at the age of 8, Mr. Mccabe was diagnosed with prostate cancer, Stage IIC, Grade Group: 4.  Prostatectomy is planned 10/24/20. Patient had negative Cologuard, has not had colonoscopy.   CANCER HISTORY:  Oncology History  Malignant neoplasm of prostate ([place])  05/13/2020 Initial Diagnosis   Prostate cancer ([place])   07/28/2020 Cancer Staging   Staging form: Prostate, AJCC 8th Edition - Clinical stage from 07/28/2020: Stage IIC (cT1c, cN0, cM0, PSA: 5.1, Grade Group: 4) - Signed by Freeman Caldron, PA-C on 09/02/2020 Histopathologic type: Adenocarcinoma, NOS Stage prefix: Initial diagnosis Prostate specific antigen (PSA) range: Less than 10 Gleason primary pattern: 4 Gleason secondary pattern: 4 Gleason score: 8 Histologic grading system: 5 grade system Number of biopsy cores examined: 12 Number of biopsy cores positive: 7 Location of positive needle core biopsies: Both sides      Past Medical History:  Diagnosis Date  . Anxiety   . Panic attack   . Prostate cancer [facility])     Past Surgical History:  Procedure Laterality Date  . LEG SURGERY    . PROSTATE BIOPSY      Social History   Socioeconomic History  . Marital status: Married    Spouse name: Not on file  . Number of children: Not on file  . Years of education: Not on file   . Highest education level: Not on file  Occupational History  . Not on file  Tobacco Use  . Smoking status: Never Smoker  . Smokeless tobacco: Never Used  Vaping Use  . Vaping Use: Never used  Substance and Sexual Activity  . Alcohol use: Not Currently  . Drug use: Never  . Sexual activity: Yes  Other Topics Concern  . Not on file  Social History Narrative  . Not on file   Social Determinants of Health   Financial Resource Strain: Not on file  Food Insecurity: Not on file  Transportation Needs: Not on file  Physical Activity: Not on file  Stress: Not on file  Social Connections: Not on file     FAMILY HISTORY:  We obtained a detailed, 4-generation family history.  Significant diagnoses are listed below: Family History  Adopted: Yes  Problem Relation Age of Onset  . Breast cancer Neg Hx   . Colon cancer Neg Hx   . Pancreatic cancer Neg Hx   . Prostate cancer Neg Hx    Mr. Corporan was adopted and does not have any knowledge of family history.   Mr. Mcnerney is unaware of previous family history of genetic testing for hereditary cancer risks. Patient's maternal ancestors are of Polish/[place]ian descent, and paternal ancestors are of unknown descent. There is no reported Ashkenazi Jewish ancestry. There is no known consanguinity.  GENETIC COUNSELING ASSESSMENT: Mr. Chavarin is a 50 y.o. male with a personal history of prostate  cancer which is somewhat suggestive of a hereditary cancer syndrome and predisposition to cancer. We, therefore, discussed and recommended the following at today's visit.   DISCUSSION: We discussed that approximately 5-10% of prostate cancer is hereditary  Most cases of hereditary prostate cancer are associated with BRCA1/BRCA2 genes, although there are other genes associated with hereditary prostate cancer as well as genes related to other types of cancer such as breast, colon, etc. We discussed that testing is beneficial for several reasons  including  knowing about other cancer risks, identifying potential screening and risk-reduction options that may be appropriate, and to understand if other family members could be at risk for cancer and allow them to undergo genetic testing.   We reviewed the characteristics, features and inheritance patterns of hereditary cancer syndromes. We also discussed genetic testing, including the appropriate family members to test, the process of testing, insurance coverage and turn-around-time for results. We discussed the implications of a negative, positive and/or variant of uncertain significant result. We recommended Mr. Inclan pursue genetic testing for the [facility] CancerNext-Expanded+RNA gene panel.   The CancerNext-Expanded + RNAinsight gene panel offered by [employer] and includes sequencing and rearrangement analysis for the following 77 genes: IP, ALK, APC*, ATM*, AXIN2, BAP1, BARD1, BLM, BMPR1A, BRCA1*, BRCA2*, BRIP1*, CDC73, CDH1*,CDK4, CDKN1B, CDKN2A, CHEK2*, CTNNA1, DICER1, FANCC, FH, FLCN, GALNT12, KIF1B, LZTR1, MAX, MEN1, MET, MLH1*, MSH2*, MSH3, MSH6*, MUTYH*, NBN, NF1*, NF2, NTHL1, PALB2*, PHOX2B, PMS2*, POT1, PRKAR1A, PTCH1, PTEN*, RAD51C*, RAD51D*,RB1, RECQL, RET, SDHA, SDHAF2, SDHB, SDHC, SDHD, SMAD4, SMARCA4, SMARCB1, SMARCE1, STK11, SUFU, TMEM127, TP53*,TSC1, TSC2, VHL and XRCC2 (sequencing and deletion/duplication); EGFR, EGLN1, HOXB13, KIT, MITF, PDGFRA, POLD1 and POLE (sequencing only); EPCAM and GREM1 (deletion/duplication only).   Based on Mr. Oliff personal history of cancer, he meets medical criteria for genetic testing. Despite that he meets criteria, he may still have an out of pocket cost. We discussed that if his out of pocket cost for testing is over $100, the laboratory will call and confirm whether he wants to proceed with testing.  If the out of pocket cost of testing is less than $100 he will be billed by the genetic testing laboratory.   PLAN: After considering  the risks, benefits, and limitations, Mr. Gille provided informed consent to pursue genetic testing and the blood sample was sent to [facility] for analysis of the CancerNext-Expanded+RNA panel. Results should be available within approximately 2-3 weeks' time, at which point they will be disclosed by telephone to Mr. Zechman, as will any additional recommendations warranted by these results. Mr. Saleeby will receive a summary of his genetic counseling visit and a copy of his results once available. This information will also be available in Epic.   Mr. Mcminn questions were answered to his satisfaction today. Our contact information was provided should additional questions or concerns arise. Thank you for the referral and allowing [place] to share in the care of your patient.   Faith Rogue, MS, [facility] Genetic Counselor [email or web address] Phone: [phone]  The patient was seen for a total of 35 minutes in face-to-face genetic counseling.  Patient's wife, Bedelia Person and [facility] intern Magda Paganini were also present. Drs. Magrinat, Lindi Adie and/or Burr Medico were available for discussion regarding this case.   _______________________________________________________________________ For Office Staff:  Number of people involved in session: 3 Was an Intern/ student involved with case: yes

## 2020-09-28 ENCOUNTER — Telehealth: Payer: Self-pay | Admitting: Licensed Clinical Social Worker

## 2020-09-28 NOTE — Telephone Encounter (Signed)
Revealed negative genetic testing.  This normal result is reassuring and indicates that it is unlikely Timothy [name] cancer is due to a hereditary cause.  It is unlikely that there is an increased risk of another cancer due to a mutation in one of these genes.  However, genetic testing is not perfect, and cannot definitively rule out a hereditary cause.  It will be important for him to keep in contact with genetics to learn if any additional testing may be needed in the future.

## 2020-09-29 ENCOUNTER — Encounter: Payer: Self-pay | Admitting: Licensed Clinical Social Worker

## 2020-09-29 ENCOUNTER — Ambulatory Visit: Payer: Self-pay | Admitting: Licensed Clinical Social Worker

## 2020-09-29 DIAGNOSIS — Z1379 Encounter for other screening for genetic and chromosomal anomalies: Secondary | ICD-10-CM

## 2020-09-29 DIAGNOSIS — C61 Malignant neoplasm of prostate: Secondary | ICD-10-CM

## 2020-09-29 NOTE — Progress Notes (Signed)
HPI:  [name] was previously seen in the [facility] due to a personal history of prostate cancer and concerns regarding a hereditary predisposition to cancer. Please refer to our prior cancer genetics clinic note for more information regarding our discussion, assessment and recommendations, at the time. [name] recent genetic test results were disclosed to him, as were recommendations warranted by these results. These results and recommendations are discussed in more detail below.  CANCER HISTORY:  Oncology History  Malignant neoplasm of prostate ([place])  05/13/2020 Initial Diagnosis   Prostate cancer ([place])   07/28/2020 Cancer Staging   Staging form: Prostate, AJCC 8th Edition - Clinical stage from 07/28/2020: Stage IIC (cT1c, cN0, cM0, PSA: 5.1, Grade Group: 4) - Signed by Freeman Caldron, PA-C on 09/02/2020 Histopathologic type: Adenocarcinoma, NOS Stage prefix: Initial diagnosis Prostate specific antigen (PSA) range: Less than 10 Gleason primary pattern: 4 Gleason secondary pattern: 4 Gleason score: 8 Histologic grading system: 5 grade system Number of biopsy cores examined: 12 Number of biopsy cores positive: 7 Location of positive needle core biopsies: Both sides    Genetic Testing   Negative genetic testing. No pathogenic variants identified on the [facility] CancerNext-Expanded+RNA Panel. The report date is 09/28/2020.  The CancerNext-Expanded + RNAinsight gene panel offered by [employer] and includes sequencing and rearrangement analysis for the following 77 genes: IP, ALK, APC*, ATM*, AXIN2, BAP1, BARD1, BLM, BMPR1A, BRCA1*, BRCA2*, BRIP1*, CDC73, CDH1*,CDK4, CDKN1B, CDKN2A, CHEK2*, CTNNA1, DICER1, FANCC, FH, FLCN, GALNT12, KIF1B, LZTR1, MAX, MEN1, MET, MLH1*, MSH2*, MSH3, MSH6*, MUTYH*, NBN, NF1*, NF2, NTHL1, PALB2*, PHOX2B, PMS2*, POT1, PRKAR1A, PTCH1, PTEN*, RAD51C*, RAD51D*,RB1, RECQL, RET, SDHA, SDHAF2, SDHB, SDHC, SDHD, SMAD4, SMARCA4, SMARCB1,  SMARCE1, STK11, SUFU, TMEM127, TP53*,TSC1, TSC2, VHL and XRCC2 (sequencing and deletion/duplication); EGFR, EGLN1, HOXB13, KIT, MITF, PDGFRA, POLD1 and POLE (sequencing only); EPCAM and GREM1 (deletion/duplication only).     FAMILY HISTORY:  We obtained a detailed, 4-generation family history.  Significant diagnoses are listed below: Family History  Adopted: Yes  Problem Relation Age of Onset  . Breast cancer Neg Hx   . Colon cancer Neg Hx   . Pancreatic cancer Neg Hx   . Prostate cancer Neg Hx    [name] was adopted and does not have any knowledge of family history.   [name] is unaware of previous family history of genetic testing for hereditary cancer risks. Patient's maternal ancestors are of Polish/Ukranian descent, and paternal ancestors are of unknown descent. There is no reported Ashkenazi Jewish ancestry. There is no known consanguinity.  GENETIC TEST RESULTS: Genetic testing reported out on 09/28/2020 through the [facility] CancerNext-Expanded+RNA cancer panel found no pathogenic mutations.   The CancerNext-Expanded + RNAinsight gene panel offered by [employer] and includes sequencing and rearrangement analysis for the following 77 genes: IP, ALK, APC*, ATM*, AXIN2, BAP1, BARD1, BLM, BMPR1A, BRCA1*, BRCA2*, BRIP1*, CDC73, CDH1*,CDK4, CDKN1B, CDKN2A, CHEK2*, CTNNA1, DICER1, FANCC, FH, FLCN, GALNT12, KIF1B, LZTR1, MAX, MEN1, MET, MLH1*, MSH2*, MSH3, MSH6*, MUTYH*, NBN, NF1*, NF2, NTHL1, PALB2*, PHOX2B, PMS2*, POT1, PRKAR1A, PTCH1, PTEN*, RAD51C*, RAD51D*,RB1, RECQL, RET, SDHA, SDHAF2, SDHB, SDHC, SDHD, SMAD4, SMARCA4, SMARCB1, SMARCE1, STK11, SUFU, TMEM127, TP53*,TSC1, TSC2, VHL and XRCC2 (sequencing and deletion/duplication); EGFR, EGLN1, HOXB13, KIT, MITF, PDGFRA, POLD1 and POLE (sequencing only); EPCAM and GREM1 (deletion/duplication only).   The test report has been scanned into EPIC and is located under the Molecular Pathology section of the Results Review tab.  A  portion of the result report is included below for reference.  We discussed with [name] that because current genetic testing is not perfect, it is possible there may be a gene mutation in one of these genes that current testing cannot detect, but that chance is small.  We also discussed, that there could be another gene that has not yet been discovered, or that we have not yet tested, that is responsible for the cancer diagnoses in the family. It is also possible there is a hereditary cause for the cancer in the family that [name] did not inherit and therefore was not identified in his testing.  Therefore, it is important to remain in touch with cancer genetics in the future so that we can continue to offer [name] the most up to date genetic testing.   ADDITIONAL GENETIC TESTING: We discussed with [name] that his genetic testing was fairly extensive.  If there are genes identified to increase cancer risk that can be analyzed in the future, we would be happy to discuss and coordinate this testing at that time.    CANCER SCREENING RECOMMENDATIONS: [name] test result is considered negative (normal).  This means that we have not identified a hereditary cause for his personal history of cancer at this time. Most cancers happen by chance and this negative test suggests that his cancer may fall into this category.    While reassuring, this does not definitively rule out a hereditary predisposition to cancer. It is still possible that there could be genetic mutations that are undetectable by current technology. There could be genetic mutations in genes that have not been tested or identified to increase cancer risk.  Therefore, it is recommended he continue to follow the cancer management and screening guidelines provided by his oncology and primary healthcare provider.   An individual's cancer risk and medical management are not determined by genetic test results  alone. Overall cancer risk assessment incorporates additional factors, including personal medical history, family history, and any available genetic information that may result in a personalized plan for cancer prevention and surveillance.  RECOMMENDATIONS FOR FAMILY MEMBERS:  Relatives in this family might be at some increased risk of developing cancer, over the general population risk, simply due to the family history of cancer.  We recommended male relatives in this family have a yearly mammogram beginning at age 60, or 48 years younger than the earliest onset of cancer, an annual clinical breast exam, and perform monthly breast self-exams. Male relatives in this family should also have a gynecological exam as recommended by their primary provider.  All family members should be referred for colonoscopy starting at age 51.   FOLLOW-UP: Lastly, we discussed with [name] that cancer genetics is a rapidly advancing field and it is possible that new genetic tests will be appropriate for him and/or his family members in the future. We encouraged him to remain in contact with cancer genetics on an annual basis so we can update his personal and family histories and let him know of advances in cancer genetics that may benefit this family.   Our contact number was provided. [name] questions were answered to his satisfaction, and he knows he is welcome to call us at anytime with additional questions or concerns.   Faith Rogue, MS, [facility] Genetic Counselor [email or web address] Phone: [phone]

## 2020-10-13 ENCOUNTER — Other Ambulatory Visit: Payer: Self-pay

## 2020-10-13 ENCOUNTER — Encounter (HOSPITAL_COMMUNITY)
Admission: RE | Admit: 2020-10-13 | Discharge: 2020-10-13 | Disposition: A | Discharge status: Home / Self Care | Payer: 59 | Source: Ambulatory Visit | Attending: Urology | Admitting: Urology

## 2020-10-13 ENCOUNTER — Encounter (HOSPITAL_COMMUNITY): Payer: Self-pay

## 2020-10-13 DIAGNOSIS — Z01812 Encounter for preprocedural laboratory examination: Secondary | ICD-10-CM | POA: Insufficient documentation

## 2020-10-13 HISTORY — DX: Inflammatory liver disease, unspecified: K75.9

## 2020-10-13 HISTORY — DX: Personal history of urinary calculi: Z87.442

## 2020-10-13 LAB — TYPE AND SCREEN
ABO/RH(D): O POS
Antibody Screen: NEGATIVE

## 2020-10-13 NOTE — Progress Notes (Signed)
COVID Vaccine Completed:yes Date COVID Vaccine completed":all three but can't remember the dates" COVID vaccine manufacturer:   [employer]     PCP - N. Moreiira Cardiologist -   Chest x-ray - no EKG - no Stress Test - no ECHO - no Cardiac Cath - no Pacemaker/ICD device last checked:NA  Sleep Study - no CPAP -   Fasting Blood Sugar - NA Checks Blood Sugar _____ times a day  Blood Thinner Instructions:NA Aspirin Instructions: Last Dose:  Anesthesia review:   Patient denies shortness of breath, fever, cough and chest pain at PAT appointment yes  Patient verbalized understanding of instructions that were given to them at the PAT appointment. Patient was also instructed that they will need to review over the PAT instructions again at home before surgery.yes No SOB with any activities.

## 2020-10-13 NOTE — Patient Instructions (Signed)
DUE TO COVID-19 ONLY ONE VISITOR IS ALLOWED TO COME WITH YOU AND STAY IN THE WAITING ROOM ONLY DURING PRE OP AND PROCEDURE DAY OF SURGERY. THE 1 VISITOR  MAY VISIT WITH YOU AFTER SURGERY IN YOUR PRIVATE ROOM DURING VISITING HOURS ONLY!  YOU NEED TO HAVE A COVID 19 TEST ON__4/14_____ @_2 :55______, THIS TEST MUST BE DONE BEFORE SURGERY,  COVID TESTING SITE [employer] [place] [id number], IT IS ON THE RIGHT GOING OUT [address] APPROXIMATELY  2 MINUTES PAST [employer] ON THE RIGHT. ONCE YOUR COVID TEST IS COMPLETED,  PLEASE BEGIN THE QUARANTINE INSTRUCTIONS AS OUTLINED IN YOUR HANDOUT.                Silviano Neuser   Your procedure is scheduled on: 10/24/20   Report to [facility] Main  Entrance   Report to admitting at 9:45 AM     Call this number if you have problems the morning of surgery [phone]    Remember: Do not eat food or drink liquids :After Midnight.   BRUSH YOUR TEETH MORNING OF SURGERY AND RINSE YOUR MOUTH OUT, NO CHEWING GUM CANDY OR MINTS.     Take these medicines the morning of surgery with A SIP OF WATER: Buspirone, Lexapro                                 You may not have any metal on your body including              piercings  Do not wear jewelry,  lotions, powders or deodorant                        Men may shave face and neck.   Do not bring valuables to the hospital. [place].  Contacts, dentures or bridgework may not be worn into surgery.     Special Instructions: N/A              Please read over the following fact sheets you were given: _____________________________________________________________________             [facility] - Preparing for Surgery Before surgery, you can play an important role.  Because skin is not sterile, your skin needs to be as free of germs as possible.  You can reduce the number of germs on your skin by washing with CHG  (chlorahexidine gluconate) soap before surgery.  CHG is an antiseptic cleaner which kills germs and bonds with the skin to continue killing germs even after washing. Please DO NOT use if you have an allergy to CHG or antibacterial soaps.  If your skin becomes reddened/irritated stop using the CHG and inform your nurse when you arrive at Short Stay. Do not shave (including legs and underarms) for at least 48 hours prior to the first CHG shower.  You may shave your face/neck. Please follow these instructions carefully:  1.  Shower with CHG Soap the night before surgery and the  morning of Surgery.  2.  If you choose to wash your hair, wash your hair first as usual with your  normal  shampoo.  3.  After you shampoo, rinse your hair and body thoroughly to remove the  shampoo.  4.  Use CHG as you would any other liquid soap.  You can apply chg directly  to the skin and wash                       Gently with a scrungie or clean washcloth.  5.  Apply the CHG Soap to your body ONLY FROM THE NECK DOWN.   Do not use on face/ open                           Wound or open sores. Avoid contact with eyes, ears mouth and genitals (private parts).                       Wash face,  Genitals (private parts) with your normal soap.             6.  Wash thoroughly, paying special attention to the area where your surgery  will be performed.  7.  Thoroughly rinse your body with warm water from the neck down.  8.  DO NOT shower/wash with your normal soap after using and rinsing off  the CHG Soap.             9.  Pat yourself dry with a clean towel.            10.  Wear clean pajamas.            11.  Place clean sheets on your bed the night of your first shower and do not  sleep with pets. Day of Surgery : Do not apply any lotions/deodorants the morning of surgery.  Please wear clean clothes to the hospital/surgery center.  FAILURE TO FOLLOW THESE INSTRUCTIONS MAY RESULT IN THE  CANCELLATION OF YOUR SURGERY PATIENT SIGNATURE_________________________________  NURSE SIGNATURE__________________________________  ________________________________________________________________________   Adam Phenix  An incentive spirometer is a tool that can help keep your lungs clear and active. This tool measures how well you are filling your lungs with each breath. Taking long deep breaths may help reverse or decrease the chance of developing breathing (pulmonary) problems (especially infection) following:  A long period of time when you are unable to move or be active. BEFORE THE PROCEDURE   If the spirometer includes an indicator to show your best effort, your nurse or respiratory therapist will set it to a desired goal.  If possible, sit up straight or lean slightly forward. Try not to slouch.  Hold the incentive spirometer in an upright position. INSTRUCTIONS FOR USE  1. Sit on the edge of your bed if possible, or sit up as far as you can in bed or on a chair. 2. Hold the incentive spirometer in an upright position. 3. Breathe out normally. 4. Place the mouthpiece in your mouth and seal your lips tightly around it. 5. Breathe in slowly and as deeply as possible, raising the piston or the ball toward the top of the column. 6. Hold your breath for 3-5 seconds or for as long as possible. Allow the piston or ball to fall to the bottom of the column. 7. Remove the mouthpiece from your mouth and breathe out normally. 8. Rest for a few seconds and repeat Steps 1 through 7 at least 10 times every 1-2 hours when you are awake. Take your time and take a few normal breaths between deep breaths. 9. The spirometer may include an indicator to show your best effort.  Use the indicator as a goal to work toward during each repetition. 10. After each set of 10 deep breaths, practice coughing to be sure your lungs are clear. If you have an incision (the cut made at the time of surgery),  support your incision when coughing by placing a pillow or rolled up towels firmly against it. Once you are able to get out of bed, walk around indoors and cough well. You may stop using the incentive spirometer when instructed by your caregiver.  RISKS AND COMPLICATIONS  Take your time so you do not get dizzy or light-headed.  If you are in pain, you may need to take or ask for pain medication before doing incentive spirometry. It is harder to take a deep breath if you are having pain. AFTER USE  Rest and breathe slowly and easily.  It can be helpful to keep track of a log of your progress. Your caregiver can provide you with a simple table to help with this. If you are using the spirometer at home, follow these instructions: [place] IF:   You are having difficultly using the spirometer.  You have trouble using the spirometer as often as instructed.  Your pain medication is not giving enough relief while using the spirometer.  You develop fever of 100.5 F (38.1 C) or higher. SEEK IMMEDIATE MEDICAL CARE IF:   You cough up bloody sputum that had not been present before.  You develop fever of 102 F (38.9 C) or greater.  You develop worsening pain at or near the incision site. MAKE SURE YOU:   Understand these instructions.  Will watch your condition.  Will get help right away if you are not doing well or get worse. Document Released: 11/05/2006 Document Revised: 09/17/2011 Document Reviewed: 01/06/2007 [facility] Patient Information 2014 [place], [place].   ________________________________________________________________________

## 2020-10-20 ENCOUNTER — Other Ambulatory Visit (HOSPITAL_COMMUNITY)
Admission: RE | Admit: 2020-10-20 | Discharge: 2020-10-20 | Disposition: A | Discharge status: Home / Self Care | Payer: 59 | Source: Ambulatory Visit | Attending: Urology | Admitting: Urology

## 2020-10-20 DIAGNOSIS — Z01812 Encounter for preprocedural laboratory examination: Secondary | ICD-10-CM | POA: Diagnosis present

## 2020-10-20 DIAGNOSIS — Z20822 Contact with and (suspected) exposure to covid-19: Secondary | ICD-10-CM | POA: Diagnosis not present

## 2020-10-20 LAB — SARS CORONAVIRUS 2 (TAT 6-24 HRS): SARS Coronavirus 2: NEGATIVE

## 2020-10-24 ENCOUNTER — Encounter (HOSPITAL_COMMUNITY): Payer: Self-pay | Admitting: Urology

## 2020-10-24 ENCOUNTER — Encounter (HOSPITAL_COMMUNITY)
Admission: RE | Disposition: A | Discharge status: Home / Self Care | Payer: Self-pay | Source: Home / Self Care | Attending: Urology

## 2020-10-24 ENCOUNTER — Other Ambulatory Visit: Payer: Self-pay

## 2020-10-24 ENCOUNTER — Ambulatory Visit (HOSPITAL_COMMUNITY): Payer: 59 | Admitting: Certified Registered Nurse Anesthetist

## 2020-10-24 ENCOUNTER — Observation Stay (HOSPITAL_COMMUNITY)
Admission: RE | Admit: 2020-10-24 | Discharge: 2020-10-25 | Disposition: A | Discharge status: Home / Self Care | Payer: 59 | Attending: Urology | Admitting: Urology

## 2020-10-24 DIAGNOSIS — C61 Malignant neoplasm of prostate: Principal | ICD-10-CM | POA: Insufficient documentation

## 2020-10-24 HISTORY — PX: ROBOT ASSISTED LAPAROSCOPIC RADICAL PROSTATECTOMY: SHX5141

## 2020-10-24 HISTORY — PX: LYMPHADENECTOMY: SHX5960

## 2020-10-24 LAB — HEMOGLOBIN AND HEMATOCRIT, BLOOD
HCT: 44.5 % (ref 39.0–52.0)
Hemoglobin: 15 g/dL (ref 13.0–17.0)

## 2020-10-24 SURGERY — PROSTATECTOMY, RADICAL, ROBOT-ASSISTED, LAPAROSCOPIC
Anesthesia: General

## 2020-10-24 MED ORDER — FENTANYL CITRATE (PF) 100 MCG/2ML IJ SOLN
INTRAMUSCULAR | Status: AC
  Filled 2020-10-24: qty 2

## 2020-10-24 MED ORDER — DOCUSATE SODIUM 100 MG PO CAPS: 100.0000 mg | ORAL_CAPSULE | Freq: Two times a day (BID) | ORAL | Status: DC

## 2020-10-24 MED ORDER — PHENYLEPHRINE 40 MCG/ML (10ML) SYRINGE FOR IV PUSH (FOR BLOOD PRESSURE SUPPORT)
PREFILLED_SYRINGE | INTRAVENOUS | Status: AC
  Filled 2020-10-24: qty 10

## 2020-10-24 MED ORDER — HYDROMORPHONE HCL 1 MG/ML IJ SOLN
0.5000 mg | INTRAMUSCULAR | Status: DC | PRN
  Administered 2020-10-24 (×2): 1 mg via INTRAVENOUS
  Filled 2020-10-24 (×2): qty 1

## 2020-10-24 MED ORDER — PROPOFOL 10 MG/ML IV BOLUS
INTRAVENOUS | Status: AC
  Filled 2020-10-24: qty 20

## 2020-10-24 MED ORDER — MEPERIDINE HCL 50 MG/ML IJ SOLN: 6.2500 mg | INTRAMUSCULAR | Status: DC | PRN

## 2020-10-24 MED ORDER — ROCURONIUM BROMIDE 10 MG/ML (PF) SYRINGE
PREFILLED_SYRINGE | INTRAVENOUS | Status: AC
  Filled 2020-10-24: qty 10

## 2020-10-24 MED ORDER — SODIUM CHLORIDE 0.9% FLUSH
INTRAVENOUS | Status: DC | PRN
  Administered 2020-10-24: 10 mL

## 2020-10-24 MED ORDER — MIDAZOLAM HCL 5 MG/5ML IJ SOLN
INTRAMUSCULAR | Status: DC | PRN
  Administered 2020-10-24: 2 mg via INTRAVENOUS

## 2020-10-24 MED ORDER — FENTANYL CITRATE (PF) 250 MCG/5ML IJ SOLN
INTRAMUSCULAR | Status: AC
  Filled 2020-10-24: qty 5

## 2020-10-24 MED ORDER — BACITRACIN-NEOMYCIN-POLYMYXIN 400-5-5000 EX OINT: 1.0000 "application " | TOPICAL_OINTMENT | Freq: Three times a day (TID) | CUTANEOUS | Status: DC | PRN

## 2020-10-24 MED ORDER — FENTANYL CITRATE (PF) 100 MCG/2ML IJ SOLN
INTRAMUSCULAR | Status: DC | PRN
  Administered 2020-10-24: 50 ug via INTRAVENOUS
  Administered 2020-10-24: 100 ug via INTRAVENOUS
  Administered 2020-10-24: 50 ug via INTRAVENOUS
  Administered 2020-10-24: 100 ug via INTRAVENOUS

## 2020-10-24 MED ORDER — DEXTROSE-NACL 5-0.45 % IV SOLN: INTRAVENOUS | Status: DC

## 2020-10-24 MED ORDER — EPHEDRINE SULFATE-NACL 50-0.9 MG/10ML-% IV SOSY
PREFILLED_SYRINGE | INTRAVENOUS | Status: DC | PRN
  Administered 2020-10-24 (×4): 5 mg via INTRAVENOUS

## 2020-10-24 MED ORDER — OXYCODONE HCL 5 MG/5ML PO SOLN: 5.0000 mg | Freq: Once | ORAL | Status: DC | PRN

## 2020-10-24 MED ORDER — HYDROMORPHONE HCL 1 MG/ML IJ SOLN
INTRAMUSCULAR | Status: AC
  Filled 2020-10-24: qty 1

## 2020-10-24 MED ORDER — HYDROCODONE-ACETAMINOPHEN 5-325 MG PO TABS
1.0000 | ORAL_TABLET | ORAL | Status: DC | PRN
  Administered 2020-10-24 – 2020-10-25 (×2): 2 via ORAL
  Filled 2020-10-24 (×2): qty 2

## 2020-10-24 MED ORDER — BELLADONNA ALKALOIDS-OPIUM 16.2-60 MG RE SUPP: 1.0000 | Freq: Four times a day (QID) | RECTAL | Status: DC | PRN

## 2020-10-24 MED ORDER — ONDANSETRON HCL 4 MG/2ML IJ SOLN
INTRAMUSCULAR | Status: DC | PRN
  Administered 2020-10-24: 4 mg via INTRAVENOUS

## 2020-10-24 MED ORDER — SODIUM CHLORIDE 0.9 % IV BOLUS: 1000.0000 mL | Freq: Once | INTRAVENOUS | Status: DC

## 2020-10-24 MED ORDER — MIDAZOLAM HCL 2 MG/2ML IJ SOLN: 0.5000 mg | Freq: Once | INTRAMUSCULAR | Status: DC | PRN

## 2020-10-24 MED ORDER — PROPOFOL 10 MG/ML IV BOLUS
INTRAVENOUS | Status: DC | PRN
  Administered 2020-10-24: 200 mg via INTRAVENOUS

## 2020-10-24 MED ORDER — DIPHENHYDRAMINE HCL 50 MG/ML IJ SOLN
12.5000 mg | Freq: Four times a day (QID) | INTRAMUSCULAR | Status: DC | PRN
  Administered 2020-10-25: 25 mg via INTRAVENOUS
  Filled 2020-10-24: qty 1

## 2020-10-24 MED ORDER — DOCUSATE SODIUM 100 MG PO CAPS
100.0000 mg | ORAL_CAPSULE | Freq: Two times a day (BID) | ORAL | Status: DC
  Administered 2020-10-25: 100 mg via ORAL
  Filled 2020-10-24 (×2): qty 1

## 2020-10-24 MED ORDER — SULFAMETHOXAZOLE-TRIMETHOPRIM 800-160 MG PO TABS: 1.0000 | ORAL_TABLET | Freq: Two times a day (BID) | ORAL | 0 refills | Status: DC

## 2020-10-24 MED ORDER — ACETAMINOPHEN 325 MG PO TABS: 650.0000 mg | ORAL_TABLET | ORAL | Status: DC | PRN

## 2020-10-24 MED ORDER — EPHEDRINE 5 MG/ML INJ
INTRAVENOUS | Status: AC
  Filled 2020-10-24: qty 10

## 2020-10-24 MED ORDER — LACTATED RINGERS IR SOLN
Status: DC | PRN
  Administered 2020-10-24: 1000 mL

## 2020-10-24 MED ORDER — DIPHENHYDRAMINE HCL 12.5 MG/5ML PO ELIX: 12.5000 mg | ORAL_SOLUTION | Freq: Four times a day (QID) | ORAL | Status: DC | PRN

## 2020-10-24 MED ORDER — HYDROMORPHONE HCL 1 MG/ML IJ SOLN
0.2500 mg | INTRAMUSCULAR | Status: DC | PRN
  Administered 2020-10-24: 0.5 mg via INTRAVENOUS

## 2020-10-24 MED ORDER — OXYCODONE HCL 5 MG PO TABS: 5.0000 mg | ORAL_TABLET | Freq: Once | ORAL | Status: DC | PRN

## 2020-10-24 MED ORDER — CEFAZOLIN SODIUM-DEXTROSE 2-4 GM/100ML-% IV SOLN
2.0000 g | Freq: Once | INTRAVENOUS | Status: AC
  Administered 2020-10-24: 2 g via INTRAVENOUS
  Filled 2020-10-24: qty 100

## 2020-10-24 MED ORDER — MAGNESIUM CITRATE PO SOLN
1.0000 | Freq: Once | ORAL | Status: DC
  Filled 2020-10-24: qty 296

## 2020-10-24 MED ORDER — DEXAMETHASONE SODIUM PHOSPHATE 4 MG/ML IJ SOLN
INTRAMUSCULAR | Status: DC | PRN
  Administered 2020-10-24: 10 mg via INTRAVENOUS

## 2020-10-24 MED ORDER — PROMETHAZINE HCL 25 MG/ML IJ SOLN: 6.2500 mg | INTRAMUSCULAR | Status: DC | PRN

## 2020-10-24 MED ORDER — LIDOCAINE 2% (20 MG/ML) 5 ML SYRINGE
INTRAMUSCULAR | Status: DC | PRN
  Administered 2020-10-24: 20 mg via INTRAVENOUS

## 2020-10-24 MED ORDER — SUGAMMADEX SODIUM 200 MG/2ML IV SOLN
INTRAVENOUS | Status: DC | PRN
  Administered 2020-10-24: 200 mg via INTRAVENOUS

## 2020-10-24 MED ORDER — LACTATED RINGERS IV SOLN: INTRAVENOUS | Status: DC | PRN

## 2020-10-24 MED ORDER — BUSPIRONE HCL 5 MG PO TABS
10.0000 mg | ORAL_TABLET | Freq: Every day | ORAL | Status: DC
  Administered 2020-10-25: 10 mg via ORAL
  Filled 2020-10-24: qty 2

## 2020-10-24 MED ORDER — MIDAZOLAM HCL 2 MG/2ML IJ SOLN
INTRAMUSCULAR | Status: AC
  Filled 2020-10-24: qty 2

## 2020-10-24 MED ORDER — SODIUM CHLORIDE (PF) 0.9 % IJ SOLN
INTRAMUSCULAR | Status: AC
  Filled 2020-10-24: qty 20

## 2020-10-24 MED ORDER — BUPIVACAINE LIPOSOME 1.3 % IJ SUSP
20.0000 mL | Freq: Once | INTRAMUSCULAR | Status: AC
  Administered 2020-10-24: 20 mL
  Filled 2020-10-24: qty 20

## 2020-10-24 MED ORDER — ONDANSETRON HCL 4 MG/2ML IJ SOLN: 4.0000 mg | INTRAMUSCULAR | Status: DC | PRN

## 2020-10-24 MED ORDER — LIDOCAINE 2% (20 MG/ML) 5 ML SYRINGE
INTRAMUSCULAR | Status: AC
  Filled 2020-10-24: qty 5

## 2020-10-24 MED ORDER — ACETAMINOPHEN 500 MG PO TABS: 1000.0000 mg | ORAL_TABLET | Freq: Once | ORAL | Status: DC

## 2020-10-24 MED ORDER — ROCURONIUM BROMIDE 10 MG/ML (PF) SYRINGE
PREFILLED_SYRINGE | INTRAVENOUS | Status: DC | PRN
  Administered 2020-10-24: 70 mg via INTRAVENOUS
  Administered 2020-10-24: 10 mg via INTRAVENOUS
  Administered 2020-10-24 (×2): 30 mg via INTRAVENOUS
  Administered 2020-10-24: 10 mg via INTRAVENOUS

## 2020-10-24 MED ORDER — FLEET ENEMA 7-19 GM/118ML RE ENEM
1.0000 | ENEMA | Freq: Once | RECTAL | Status: DC
  Filled 2020-10-24: qty 1

## 2020-10-24 MED ORDER — PHENYLEPHRINE 40 MCG/ML (10ML) SYRINGE FOR IV PUSH (FOR BLOOD PRESSURE SUPPORT)
PREFILLED_SYRINGE | INTRAVENOUS | Status: DC | PRN
  Administered 2020-10-24: 120 ug via INTRAVENOUS
  Administered 2020-10-24 (×3): 80 ug via INTRAVENOUS
  Administered 2020-10-24 (×2): 120 ug via INTRAVENOUS

## 2020-10-24 MED ORDER — ESCITALOPRAM OXALATE 20 MG PO TABS
20.0000 mg | ORAL_TABLET | Freq: Every day | ORAL | Status: DC
  Administered 2020-10-25: 20 mg via ORAL
  Filled 2020-10-24: qty 1

## 2020-10-24 MED ORDER — TRAMADOL HCL 50 MG PO TABS: 50.0000 mg | ORAL_TABLET | Freq: Four times a day (QID) | ORAL | 0 refills | Status: DC | PRN

## 2020-10-24 SURGICAL SUPPLY — 63 items
APPLICATOR COTTON TIP 6 STRL (MISCELLANEOUS) ×2 IMPLANT
APPLICATOR COTTON TIP 6IN STRL (MISCELLANEOUS) ×3
APPLICATOR SURGIFLO ENDO (HEMOSTASIS) IMPLANT
CATH FOLEY 2WAY SLVR  5CC 18FR (CATHETERS) ×1
CATH FOLEY 2WAY SLVR 5CC 18FR (CATHETERS) ×2 IMPLANT
CATH ROBINSON RED A/P 16FR (CATHETERS) ×3 IMPLANT
CATH SILICONE 5CC 18FR (INSTRUMENTS) ×3 IMPLANT
CHLORAPREP W/TINT 26 (MISCELLANEOUS) ×3 IMPLANT
CLIP VESOLOCK LG 6/CT PURPLE (CLIP) ×6 IMPLANT
COVER SURGICAL LIGHT HANDLE (MISCELLANEOUS) ×3 IMPLANT
COVER TIP SHEARS 8 DVNC (MISCELLANEOUS) ×2 IMPLANT
COVER TIP SHEARS 8MM DA VINCI (MISCELLANEOUS) ×1
COVER WAND RF STERILE (DRAPES) IMPLANT
DERMABOND ADVANCED (GAUZE/BANDAGES/DRESSINGS) ×1
DERMABOND ADVANCED .7 DNX12 (GAUZE/BANDAGES/DRESSINGS) ×2 IMPLANT
DRAIN CHANNEL RND F F (WOUND CARE) IMPLANT
DRAPE ARM DVNC X/XI (DISPOSABLE) ×8 IMPLANT
DRAPE COLUMN DVNC XI (DISPOSABLE) ×2 IMPLANT
DRAPE DA VINCI XI ARM (DISPOSABLE) ×4
DRAPE DA VINCI XI COLUMN (DISPOSABLE) ×1
DRAPE SURG IRRIG POUCH 19X23 (DRAPES) ×3 IMPLANT
DRSG TEGADERM 4X4.75 (GAUZE/BANDAGES/DRESSINGS) IMPLANT
ELECT PENCIL ROCKER SW 15FT (MISCELLANEOUS) ×3 IMPLANT
ELECT REM PT RETURN 15FT ADLT (MISCELLANEOUS) ×3 IMPLANT
GAUZE 4X4 16PLY RFD (DISPOSABLE) IMPLANT
GLOVE SURG ENC MOIS LTX SZ6.5 (GLOVE) ×3 IMPLANT
GLOVE SURG ENC TEXT LTX SZ7 (GLOVE) ×6 IMPLANT
GLOVE SURG UNDER POLY LF SZ7.5 (GLOVE) ×6 IMPLANT
GOWN STRL REUS W/TWL LRG LVL3 (GOWN DISPOSABLE) ×9 IMPLANT
HEMOSTAT POWDER SURGIFOAM 1G (HEMOSTASIS) IMPLANT
HEMOSTAT SURGICEL 4X8 (HEMOSTASIS) ×3 IMPLANT
HOLDER FOLEY CATH W/STRAP (MISCELLANEOUS) ×3 IMPLANT
IRRIG SUCT STRYKERFLOW 2 WTIP (MISCELLANEOUS) ×3
IRRIGATION SUCT STRKRFLW 2 WTP (MISCELLANEOUS) ×2 IMPLANT
IV LACTATED RINGERS 1000ML (IV SOLUTION) ×3 IMPLANT
KIT TURNOVER KIT A (KITS) ×3 IMPLANT
MARKER SKIN DUAL TIP RULER LAB (MISCELLANEOUS) ×3 IMPLANT
NEEDLE INSUFFLATION 14GA 120MM (NEEDLE) ×3 IMPLANT
PACK ROBOT UROLOGY CUSTOM (CUSTOM PROCEDURE TRAY) ×3 IMPLANT
PROTECTOR NERVE ULNAR (MISCELLANEOUS) ×3 IMPLANT
SEAL CANN UNIV 5-8 DVNC XI (MISCELLANEOUS) ×8 IMPLANT
SEAL XI 5MM-8MM UNIVERSAL (MISCELLANEOUS) ×4
SET TUBE SMOKE EVAC HIGH FLOW (TUBING) ×3 IMPLANT
SOLUTION ELECTROLUBE (MISCELLANEOUS) ×3 IMPLANT
SURGIFLO W/THROMBIN 8M KIT (HEMOSTASIS) IMPLANT
SUT ETHILON 2 0 PS N (SUTURE) ×3 IMPLANT
SUT MNCRL 3 0 VIOLET RB1 (SUTURE) IMPLANT
SUT MNCRL AB 4-0 PS2 18 (SUTURE) ×6 IMPLANT
SUT MONOCRYL 3 0 RB1 (SUTURE)
SUT PDS AB 0 CT1 36 (SUTURE) ×6 IMPLANT
SUT VIC AB 0 CT1 27 (SUTURE) ×3
SUT VIC AB 0 CT1 27XBRD ANTBC (SUTURE) ×6 IMPLANT
SUT VIC AB 2-0 SH 27 (SUTURE) ×1
SUT VIC AB 2-0 SH 27XBRD (SUTURE) ×2 IMPLANT
SUT VIC AB 3-0 SH 27 (SUTURE)
SUT VIC AB 3-0 SH 27X BRD (SUTURE) IMPLANT
SUT VIC AB 4-0 RB1 27 (SUTURE)
SUT VIC AB 4-0 RB1 27XBRD (SUTURE) IMPLANT
SUT VLOC 3-0 9IN GRN (SUTURE) IMPLANT
SUT VLOC BARB 180 ABS3/0GR12 (SUTURE) ×12
SUTURE VLOC BRB 180 ABS3/0GR12 (SUTURE) ×8 IMPLANT
TOWEL OR NON WOVEN STRL DISP B (DISPOSABLE) ×3 IMPLANT
WATER STERILE IRR 1000ML POUR (IV SOLUTION) ×3 IMPLANT

## 2020-10-24 NOTE — Transfer of Care (Signed)
Immediate Anesthesia Transfer of Care Note  Patient: Timothy [name]  Procedure(s) Performed: Procedure(s) with comments: XI ROBOTIC ASSISTED LAPAROSCOPIC RADICAL PROSTATECTOMY (N/A) - NEEDS 240 MIN LYMPHADENECTOMY, PELVIC (Bilateral)  Patient Location: PACU  Anesthesia Type:General  Level of Consciousness: Alert, Awake, Oriented  Airway & Oxygen Therapy: Patient Spontanous Breathing  Post-op Assessment: Report given to RN  Post vital signs: Reviewed and stable  Last Vitals:  Vitals:   10/24/20 1001  BP: (!) 137/95  Pulse: 85  Resp: 18  Temp: 36.8 C  SpO2: 08%    Complications: No apparent anesthesia complications

## 2020-10-24 NOTE — Anesthesia Preprocedure Evaluation (Addendum)
Anesthesia Evaluation  Patient identified by MRN, date of birth, ID band Patient awake    Reviewed: Allergy & Precautions, NPO status , Patient's Chart, lab work & pertinent test results  History of Anesthesia Complications Negative for: history of anesthetic complications  Airway Mallampati: II  TM Distance: >3 FB Neck ROM: Full    Dental  (+) Dental Advisory Given   Pulmonary  10/20/2020 SARS coronavirus NEG   breath sounds clear to auscultation       Cardiovascular negative cardio ROS   Rhythm:Regular Rate:Normal     Neuro/Psych Anxiety Depression negative neurological ROS     GI/Hepatic GERD  Controlled,(+) Hepatitis - (remote history)  Endo/Other  obese  Renal/GU negative Renal ROS   Prostate cancer    Musculoskeletal   Abdominal (+) + obese,   Peds  Hematology negative hematology ROS (+)   Anesthesia Other Findings   Reproductive/Obstetrics                            Anesthesia Physical Anesthesia Plan  ASA: II  Anesthesia Plan: General   Post-op Pain Management:    Induction: Intravenous  PONV Risk Score and Plan: 2 and Ondansetron and Dexamethasone  Airway Management Planned: Oral ETT  Additional Equipment: None  Intra-op Plan:   Post-operative Plan: Extubation in OR  Informed Consent: I have reviewed the patients History and Physical, chart, labs and discussed the procedure including the risks, benefits and alternatives for the proposed anesthesia with the patient or authorized representative who has indicated his/her understanding and acceptance.     Dental advisory given  Plan Discussed with: CRNA and Surgeon  Anesthesia Plan Comments:        Anesthesia Quick Evaluation

## 2020-10-24 NOTE — Anesthesia Procedure Notes (Signed)
Procedure Name: Intubation Date/Time: 10/24/2020 11:18 AM Performed by: Claudia Desanctis, CRNA Pre-anesthesia Checklist: Patient identified, Emergency Drugs available, Suction available and Patient being monitored Patient Re-evaluated:Patient Re-evaluated prior to induction Oxygen Delivery Method: Circle system utilized Preoxygenation: Pre-oxygenation with 100% oxygen Induction Type: IV induction Ventilation: Mask ventilation without difficulty Laryngoscope Size: 2 and Miller Grade View: Grade I Tube type: Oral Tube size: 7.5 mm Number of attempts: 1 Airway Equipment and Method: Stylet Placement Confirmation: ETT inserted through vocal cords under direct vision,  positive ETCO2 and breath sounds checked- equal and bilateral Secured at: 21 cm Tube secured with: Tape Dental Injury: Teeth and Oropharynx as per pre-operative assessment

## 2020-10-24 NOTE — Anesthesia Postprocedure Evaluation (Signed)
Anesthesia Post Note  Patient: Scot Shiraishi  Procedure(s) Performed: XI ROBOTIC ASSISTED LAPAROSCOPIC RADICAL PROSTATECTOMY (N/A ) LYMPHADENECTOMY, PELVIC (Bilateral )     Patient location during evaluation: PACU Anesthesia Type: General Level of consciousness: awake and alert, patient cooperative and oriented Pain management: pain level controlled Vital Signs Assessment: post-procedure vital signs reviewed and stable Respiratory status: spontaneous breathing, nonlabored ventilation and respiratory function stable Cardiovascular status: blood pressure returned to baseline and stable Postop Assessment: no apparent nausea or vomiting Anesthetic complications: no   No complications documented.  Last Vitals:  Vitals:   10/24/20 1615 10/24/20 1630  BP: 131/88 132/89  Pulse: 80 78  Resp: 14 20  Temp:    SpO2: 95% 96%    Last Pain:  Vitals:   10/24/20 1615  TempSrc:   PainSc: 0-No pain                 Taedyn Glasscock,E. Fraida Veldman

## 2020-10-24 NOTE — H&P (Signed)
Urology Preoperative H&P   Chief Complaint: Prostate cancer  History of Present Illness: Timothy [name] is a 50 y.o. male with localized prostate cancer here for RALP with b/l PLND. Denies fevers, chills.    Past Medical History:  Diagnosis Date  . Anxiety   . Hepatitis    age 62.   Timothy [name] History of kidney stones   . Panic attack   . Prostate cancer [name]     Past Surgical History:  Procedure Laterality Date  . FRACTURE SURGERY Right 2012   rods, screws  . LEG SURGERY    . PROSTATE BIOPSY    . WISDOM TOOTH EXTRACTION      Allergies:  Allergies  Allergen Reactions  . Other Other (See Comments)    Pt states he stays awake for a very long period of time.  Timothy [name] Percocet [Oxycodone-Acetaminophen] Other (See Comments)    Pt states he stays awake for a very long period of time.  . Tetanus Toxoids Swelling  . Shellfish-Derived Products Rash    Clams and Oysters    Family History  Adopted: Yes  Problem Relation Age of Onset  . Breast cancer Neg Hx   . Colon cancer Neg Hx   . Pancreatic cancer Neg Hx   . Prostate cancer Neg Hx     Social History:  reports that he has never smoked. He has never used smokeless tobacco. He reports previous alcohol use. He reports that he does not use drugs.  ROS: A complete review of systems was performed.  All systems are negative except for pertinent findings as noted.  Physical Exam:  Vital signs in last 24 hours: Temp:  [98.3 F (36.8 C)] 98.3 F (36.8 C) (04/18 1001) Pulse Rate:  [85] 85 (04/18 1001) Resp:  [18] 18 (04/18 1001) BP: (137)/(95) 137/95 (04/18 1001) SpO2:  [96 %] 96 % (04/18 1001) Weight:  [96.6 kg] 96.6 kg (04/18 1008) Constitutional:  Alert and oriented, No acute distress Cardiovascular: Regular rate and rhythm Respiratory: Normal respiratory effort, Lungs clear bilaterally GI: Abdomen is soft, nontender, nondistended, no abdominal masses GU: No CVA tenderness Lymphatic: No lymphadenopathy Neurologic:  Grossly intact, no focal deficits Psychiatric: Normal mood and affect  Laboratory Data:  No results for input(s): WBC, HGB, HCT, PLT in the last 72 hours.  No results for input(s): NA, K, CL, GLUCOSE, BUN, CALCIUM, CREATININE in the last 72 hours.  Invalid input(s): CO3   No results found for this or any previous visit (from the past 24 hour(s)). Recent Results (from the past 240 hour(s))  SARS CORONAVIRUS 2 (TAT 6-24 HRS) Nasopharyngeal Nasopharyngeal Swab     Status: None   Collection Time: 10/20/20  2:41 PM   Specimen: Nasopharyngeal Swab  Result Value Ref Range Status   SARS Coronavirus 2 NEGATIVE NEGATIVE Final    Comment: (NOTE) SARS-CoV-2 target nucleic acids are NOT DETECTED.  The SARS-CoV-2 RNA is generally detectable in upper and lower respiratory specimens during the acute phase of infection. Negative results do not preclude SARS-CoV-2 infection, do not rule out co-infections with other pathogens, and should not be used as the sole basis for treatment or other patient management decisions. Negative results must be combined with clinical observations, patient history, and epidemiological information. The expected result is Negative.  Fact Sheet for Patients: SugarRoll.be  Fact Sheet for Healthcare Providers: https://www.woods-mathews.com/  This test is not yet approved or cleared by the [place] FDA and  has been authorized for detection and/or diagnosis of  SARS-CoV-2 by FDA under an Emergency Use Authorization (EUA). This EUA will remain  in effect (meaning this test can be used) for the duration of the COVID-19 declaration under Se ction 564(b)(1) of the Act, 21 U.S.C. section 360bbb-3(b)(1), unless the authorization is terminated or revoked sooner.  Performed at [facility], [place] [address] [place], [place] [address]     Renal Function: No results for input(s): CREATININE in the last 168 hours. CrCl  cannot be calculated (Patient's most recent lab result is older than the maximum 21 days allowed.).  Radiologic Imaging: No results found.  I independently reviewed the above imaging studies.  Assessment and Plan Timothy [name] is a 50 y.o. male with localized prostate cancer here for RALP with b/l PLND. Ok to proceed.  Matt R. Lajuanda Penick MD 10/24/2020, 10:36 AM  [facility] Pager: [phone]

## 2020-10-24 NOTE — Discharge Instructions (Signed)
°  1. Activity:  You are encouraged to ambulate frequently (about every hour during waking hours) to help prevent blood clots from forming in your legs or lungs.  However, you should not engage in any heavy lifting (> 10-15 lbs), strenuous activity, or straining. °2. Diet: You should continue a clear liquid diet until passing gas from below.  Once this occurs, you may advance your diet to a soft diet that would be easy to digest (i.e soups, scrambled eggs, mashed potatoes, etc.) for 24 hours just as you would if getting over a bad stomach flu.  If tolerating this diet well for 24 hours, you may then begin eating regular food.  It will be normal to have some amount of bloating, nausea, and abdominal discomfort intermittently. °3. Prescriptions:  You will be provided a prescription for pain medication to take as needed.  If your pain is not severe enough to require the prescription pain medication, you may take Tylenol instead.  You should also take an over the counter stool softener (Colace 100 mg twice daily) to avoid straining with bowel movements as the pain medication may constipate you. Finally, you will also be provided a prescription for an antibiotic to begin the day prior to your return visit in the office for catheter removal. °4. Catheter care: You will be taught how to take care of the catheter by the nursing staff prior to discharge from the hospital.  You may use both a leg bag and the larger bedside bag but it is recommended to at least use the bigger bedside bag at nighttime as the leg bag is small and will fill up overnight and also does not drain as well when lying flat. You may periodically feel a strong urge to void with the catheter in place.  This is a bladder spasm and most often can occur when having a bowel movement or when you are moving around. It is typically self-limited and usually will stop after a few minutes.  You may use some Vaseline or Neosporin around the tip of the catheter to  reduce friction at the tip of the penis. °5. Incisions: You may remove your dressing bandages the 2nd day after surgery.  You most likely will have a few small staples in each of the incisions and once the bandages are removed, the incisions may stay open to air.  You may start showering (not soaking or bathing in water) 48 hours after surgery and the incisions simply need to be patted dry after the shower.  No additional care is needed. °6. What to call us about: You should call the office ([phone]) if you develop fever > 101, persistent vomiting, or the catheter stops draining. Also, feel free to call with any other questions you may have and remember the handout that was provided to you as a reference preoperatively which answers many of the common questions that arise after surgery. °7. You may resume mobic, aspirin, advil, aleve, vitamins, and supplements 7 days after surgery. °

## 2020-10-24 NOTE — Op Note (Signed)
Operative Note  Preoperative diagnosis:  1.  Localized prostate cancer  Postoperative diagnosis: 1.  Localized prostate cancer  Procedure(s): 1.  Robotic assisted laparoscopic radical prostatectomy 2.  Robotic assisted laparoscopic bilateral pelvic lymph node dissection  Surgeon: Rexene Alberts, MD  Assistants:   Debbrah Alar, PA  An assistant was required for this surgical procedure.  The duties of the assistant included but were not limited to suctioning, passing suture, camera manipulation, retraction.  This procedure would not be able to be performed without an Environmental consultant.   Anesthesia:  General  Complications:  None  EBL:  364ml  Specimens: 1.  Prostate with seminal vesicles 2.  Periprostatic fat 3. Bilateral pelvic lymph nodes ID Type Source Tests Collected by Time Destination  1 : Periprostatic fat Tissue PATH Soft tissue SURGICAL PATHOLOGY Janith Lima, MD 10/24/2020 1304   2 : Right pelvic lymph node Tissue PATH Lymph node excision SURGICAL PATHOLOGY Janith Lima, MD 10/24/2020 1445   3 : Left pelvic lymph node Tissue PATH Lymph node excision SURGICAL PATHOLOGY Janith Lima, MD 10/24/2020 1445   4 : Prostate Tissue PATH Male Tumor SURGICAL PATHOLOGY Janith Lima, MD 10/24/2020 1446     Drains/Catheters: 1.  18 French Foley catheter  Intraoperative findings:   1. Approximately 20 gram prostate.  No accessory pudendal vessels. Excellent hemostasis.  Water-tight anastomosis without leak.  Indication:  Stevon Gough is a 50 y.o. malewho initially presented with an elevated PSA.  Prostate biopsy showed Gleason 4+4=8 prostate cancer involving bilateral sides of the prostate.  Treatment options were discussed with him at length and he chose robotic assisted laparoscopic radical prostatectomy.  Bilateral pelvic lymph node dissection was planned due to his risk stratification.  Description of procedure: The indications, alternatives, benefits, and risks were  discussed with the patient and informed symptoms obtained.  The patient was brought to the operating room table, positioned supine and secured to the bed with a safety strap.  All pressure points were carefully padded and pneumatic compression devices were placed on lower extremities.  After the ministration of intravenous antibiotics, subcutaneous heparin preoperatively and general endotracheal anesthesia, the patient was repositioned in the dorsal lithotomy position using well-padded Allen stirrups.  The arms were carefully tucked at the patient's side and secured with padding.  The chest was secured in place with foam padding and cloth tape and the table was positioned in approximately 30 degree Trendelenburg.  A rectal exam showed the prostate to be 20cc, without nodules and not fixed. The patient's abdomen, genitalia, and upper thighs were prepped and draped in the standard sterile manner.  A time was completed, verifying the correct patient, surgical procedure and positioning prior to beginning the procedure.  An 64 French urethral catheter was inserted to drain the bladder.  Pneumoperitoneum was introduced by placing a Veress needle into the abdomen superior to the umbilicus and insufflated with CO2 to a pressure of 15 mmHg. An 8 mm blunt tip trocar was placed just above the umbilicus.  The 0 degree camera was then passed under direct visualization.  The abdominal cavity was examined for any sign of injury, adhesions, and identification of anatomic landmarks.  The remainder of the trochars were placed which included 2 separate millimeter robotic trochars which were placed 9 cm laterally and inferiorly to the initially placed camera trocar.  A 12 mm trocar was placed 8 mm lateral to the right robotic trocar.  A separate 8 mm robotic trocar was placed 8  mm lateral to the previously placed left robotic trocar on the left side.  A 5 mm trocar was placed to the right and well above the umbilicus which  approximately 10 and 12 cm away from the right-sided trochars.  The robot was then docked.  Placed monopolar scissors in the right hand, a fenestrated bipolar in the left hand and a prograsp in the fourth arm.  The urachus and median umbilical ligament was divided and we developed the space of Retzius down to the pubic bone.  I divided the parietal peritoneum laterally up to the vas deferens on each side.  Use the prograsp forcep to provide cranial traction on the urachus.  The prostate was then defatted above the prostatic vesicle junction, and the superficial dorsal venous complex was coagulated with bipolar and divided.  We submitted the periprostatic fat for pathological specimen.  The endopelvic fascia was sharply opened bilaterally and the levator muscle fibers were swept posterior laterally allowing for visualization of the deep dorsal venous complex and apex of the prostate.  The puboprostatic ligaments were sharply divided with care taken to preserve the dorsal venous complex.  I secured the dorsal venous complex with an 0 Vicryl CT1 needle in a figure-of-eight fashion.  I then addressed the bladder neck with a 30 degree down lens. I identified the bladder neck by pulling a Foley catheter.  The fourth arm was applying cranial traction on the urachus.  I divided the anterior bladder neck musculature until I found the anterior bladder neck mucosa which was then incised. I identified the Foley catheter within, the balloon was deflated, we pulled the Foley catheter out into the operating field.  The assistant then used a grasper to apply traction to the Foley catheter and the surgical tech then placed a Manteno on the catheter near the penis for optimal retraction.  I then divided the lateral bladder neck mucosa in the posterior bladder neck mucosa.  I was well away from the bilateral ureteral orifices.  I divided the posterior bladder neck musculature until I found the vas deferens.  The bilateral vas  deferens were then freed and divided.  I then freed the bilateral seminal vesicles using blunt and sharp dissection.  I placed metal clips on the seminal vesicle vessels and avoided cautery around the seminal vesicles.  I then switched back to a 0 degree lens.  I divided the Denonvilliers fascia beneath the prostate and developed the prostate off the rectum.  This plane was bluntly developed towards the apex of the prostate.  I then addressed the pedicles, first starting with the right and then moving towards the left. I then isolated the pedicles of the prostate and placed Weck clips on the pedicles and then divided the pedicles with cold scissors.  I continue to divide the neurovascular bundles off of the prostate out to the apex of the prostate.  At this point the prostate was essentially freed up except for the urethra.   I then addressed the prostate anteriorly, dividing the dorsal vein with cautery.  The anterior urethra was then sharply divided with cold scissors.  The Foley catheter was then pulled back and we divided the posterior urethral wall. Patent venous sinuses were then oversewn with a 4-0 Vicryl suture.  The specimen was then placed in a Endo Catch bag and then the bag was placed in the upper abdomen out of the way.  I then irrigated the pelvis.  We performed a rectal test by instilling air into  the rectal Foley.  The test was negative.  There is no concern for rectal injury.    I then performed a bilateral pelvic lymph node dissection by incising the fascia overlying the right external iliac vein, dissecting distally.  I went just distal to the node of Cloquet were replaced clips and then divided the lymphatics.  The lateral aspect of the dissection was the pelvic sidewall, inferior was the obturator nerve and proximal of the hypogastric vessels.  I placed clips at the proximal aspect and then divide lymphatics.  The specimen was removed with the scope grasper and sent to pathology.  Grossly,  there were no enlarged lymph nodes.  This was performed on both the right and left pelvic lymph nodes.  With good hemostasis confirmed, I then did the posterior reconstruction with a Rocco stitch.  I used a 3-0 Vloc double-armed suture on an RB1 needle.  I passed the sutures through the cut edges of Denonvilliers fascia beneath the bladder on the right side and through the posterior serrated sphincter underneath the urethra.  I ran this from right to left.  And then took the other end of the suture passing just proximal to the posterior bladder neck in the midline into the posterior serrated sphincter and around this from right to left using 3 throws and reapproximated the sutures.  I then completed the urethrovesical anastomosis using a 3-0 Vloc suture on an RB1 needle.  I passed both ends of the suture from the outside in through the bladder neck at the 6 o'clock position.  I passed both through the urethral stump from the inside out and the corresponding position.  I reapproximated the bladder neck to the urethra.  I then ran the left suture on the left side anastomosis to the 9 o'clock position.  And then went back to the right-sided suture around that up the right side of the 12 o'clock position.  I then continued the left suture to the 12 o'clock position. I identified the ureteral orifices and ensured that these were not incorporated with the sutures.  I then placed a new 18 French Foley catheter into the bladder and filled it with 10 cc of sterile water.  I then secured the knot and then passed the suture behind the pubic bone for anterior suspension.  The bladder was irrigated with 200 cc of water.  There was no leak.  Hemostasis was excellent.  A 15 French drain was placed through the previously placed fourth arm.  We did place a Carter-Thomason 0 vicryl suture through the 12 mm assistant port. The robot was undocked and all the trochars were removed under direct vision.    I enlarged the umbilical  trocar site large enough to remove the prostate and closed the fascia with 0 PDS sutures in a running fashion.  All the port sites were irrigated.  Exparel was injected to the trocar sites.  The skin was closed with 4-0 Monocryl in a running subcuticular fashion.  Skin glue was applied.  At this point, the patient was extubated and awakened in the operating room and taken to recovery room in stable condition.  There were no immediate complications.  All counts were correct.  Plan: Admit for observation overnight.  Clear liquids tonight.  Regular for breakfast tomorrow.  Anticipate discharge home tomorrow.  Matt R. [facility]  Pager: [phone]

## 2020-10-25 ENCOUNTER — Encounter (HOSPITAL_COMMUNITY): Payer: Self-pay | Admitting: Urology

## 2020-10-25 DIAGNOSIS — C61 Malignant neoplasm of prostate: Secondary | ICD-10-CM | POA: Diagnosis not present

## 2020-10-25 LAB — BASIC METABOLIC PANEL
Anion gap: 8 (ref 5–15)
BUN: 15 mg/dL (ref 6–20)
CO2: 26 mmol/L (ref 22–32)
Calcium: 8.6 mg/dL — ABNORMAL LOW (ref 8.9–10.3)
Chloride: 102 mmol/L (ref 98–111)
Creatinine, Ser: 0.81 mg/dL (ref 0.61–1.24)
GFR, Estimated: >60 mL/min (ref >60)
Glucose, Bld: 149 mg/dL — ABNORMAL HIGH (ref 70–99)
Potassium: 4.4 mmol/L (ref 3.5–5.1)
Sodium: 136 mmol/L (ref 135–145)

## 2020-10-25 LAB — HEMOGLOBIN AND HEMATOCRIT, BLOOD
HCT: 40.5 % (ref 39.0–52.0)
Hemoglobin: 13.4 g/dL (ref 13.0–17.0)

## 2020-10-25 MED ORDER — OXYBUTYNIN CHLORIDE ER 10 MG PO TB24: 10.0000 mg | ORAL_TABLET | Freq: Every day | ORAL | 1 refills | Status: AC

## 2020-10-25 MED ORDER — CHLORHEXIDINE GLUCONATE CLOTH 2 % EX PADS
6.0000 | MEDICATED_PAD | Freq: Every day | CUTANEOUS | Status: DC
  Administered 2020-10-25: 6 via TOPICAL

## 2020-10-25 NOTE — Progress Notes (Signed)
Reviewed discharge instructions with patient and wife. Patient demonstrated change of folley to leg bag.All questions answered.

## 2020-10-25 NOTE — Discharge Summary (Signed)
Date of admission: 10/24/2020  Date of discharge: 10/25/2020  Admission diagnosis: Prostate cancer  Discharge diagnosis: Prostate cancer  Secondary diagnoses: None  History and Physical: For full details, please see admission history and physical. Briefly, Timothy [name] is a 50 y.o. year old patient with localized prostate cancer here for robotic laparoscopic radical prostatectomy with bilateral pelvic lymph node dissection.   Hospital Course: The patient recovered in the usual expected fashion.  He had his diet advanced slowly.  Initially managed with IV pain control, then transitioned to PO meds when he was tolerating oral intake.  His labs were stable throughout the hospital course.  He was discharged to home on POD#1.  At the time of discharge the patient was tolerating a regular diet, passing flatus, ambulating, had adequate pain control and was agreeable to discharge.  Follow up as scheduled.    Laboratory values:  Recent Labs    10/24/20 1556 10/25/20 0503  HGB 15.0 13.4  HCT 44.5 40.5   Recent Labs    10/25/20 0503  CREATININE 0.81    Disposition: Home  Discharge instruction: The patient was instructed to be ambulatory but told to refrain from heavy lifting, strenuous activity, or driving.   Discharge medications:  Allergies as of 10/25/2020      Reactions   Other Other (See Comments)   Pt states he stays awake for a very long period of time.   Percocet [oxycodone-acetaminophen] Other (See Comments)   Pt states he stays awake for a very long period of time.   Tetanus Toxoids Swelling   Shellfish-derived Products Rash   Clams and Oysters      Medication List    STOP taking these medications   meloxicam 15 MG tablet Commonly known as: MOBIC   multivitamin capsule     TAKE these medications   busPIRone 10 MG tablet Commonly known as: BUSPAR Take 10 mg by mouth daily.   docusate sodium 100 MG capsule Commonly known as: COLACE Take 1 capsule (100  mg total) by mouth 2 (two) times daily.   escitalopram 20 MG tablet Commonly known as: LEXAPRO Take 20 mg by mouth daily.   fluticasone 50 MCG/ACT nasal spray Commonly known as: FLONASE Place 2 sprays into both nostrils daily.   oxybutynin 10 MG 24 hr tablet Commonly known as: Ditropan XL Take 1 tablet (10 mg total) by mouth daily for 7 days. Stop the night prior to foley removal.   sulfamethoxazole-trimethoprim 800-160 MG tablet Commonly known as: BACTRIM DS Take 1 tablet by mouth 2 (two) times daily. Start the day prior to foley removal appointment   traMADol 50 MG tablet Commonly known as: Ultram Take 1-2 tablets (50-100 mg total) by mouth every 6 (six) hours as needed for moderate pain or severe pain.       Followup:   Follow-up Information    Janith Lima, MD On 10/31/2020.   Specialty: Urology Why: at 8:15 Contact information: [place] [address] [place]. [facility]  Pager: [phone]

## 2020-10-25 NOTE — Plan of Care (Signed)
  Problem: Education: Goal: Knowledge of General Education information will improve Description: Including pain rating scale, medication(s)/side effects and non-pharmacologic comfort measures Outcome: Progressing   Problem: Activity: Goal: Risk for activity intolerance will decrease Outcome: Progressing   Problem: Pain Managment: Goal: General experience of comfort will improve Outcome: Progressing   

## 2020-10-27 LAB — SURGICAL PATHOLOGY

## 2021-05-12 ENCOUNTER — Other Ambulatory Visit (HOSPITAL_COMMUNITY): Payer: Self-pay | Admitting: Urology

## 2021-05-12 DIAGNOSIS — C61 Malignant neoplasm of prostate: Secondary | ICD-10-CM

## 2021-05-24 ENCOUNTER — Ambulatory Visit (HOSPITAL_COMMUNITY)
Admission: RE | Admit: 2021-05-24 | Discharge: 2021-05-24 | Disposition: A | Discharge status: Home / Self Care | Payer: 59 | Source: Ambulatory Visit | Attending: Urology | Admitting: Urology

## 2021-05-24 ENCOUNTER — Other Ambulatory Visit: Payer: Self-pay

## 2021-05-24 DIAGNOSIS — C61 Malignant neoplasm of prostate: Secondary | ICD-10-CM | POA: Diagnosis present

## 2021-05-24 MED ORDER — PIFLIFOLASTAT F 18 (PYLARIFY) INJECTION
9.0000 | Freq: Once | INTRAVENOUS | Status: AC
  Administered 2021-05-24: 9.4 via INTRAVENOUS

## 2021-06-09 ENCOUNTER — Ambulatory Visit: Payer: 59

## 2021-06-09 ENCOUNTER — Ambulatory Visit: Payer: 59 | Admitting: Urology

## 2021-06-12 ENCOUNTER — Emergency Department (HOSPITAL_COMMUNITY)
Admission: EM | Admit: 2021-06-12 | Discharge: 2021-06-13 | Disposition: A | Discharge status: Home / Self Care | Payer: 59 | Attending: Emergency Medicine | Admitting: Emergency Medicine

## 2021-06-12 ENCOUNTER — Emergency Department (HOSPITAL_COMMUNITY): Payer: 59

## 2021-06-12 DIAGNOSIS — Z8546 Personal history of malignant neoplasm of prostate: Secondary | ICD-10-CM | POA: Diagnosis not present

## 2021-06-12 DIAGNOSIS — M25512 Pain in left shoulder: Secondary | ICD-10-CM | POA: Insufficient documentation

## 2021-06-12 DIAGNOSIS — W108XXA Fall (on) (from) other stairs and steps, initial encounter: Secondary | ICD-10-CM | POA: Diagnosis not present

## 2021-06-12 DIAGNOSIS — Y906 Blood alcohol level of 120-199 mg/100 ml: Secondary | ICD-10-CM | POA: Diagnosis not present

## 2021-06-12 DIAGNOSIS — S0081XA Abrasion of other part of head, initial encounter: Secondary | ICD-10-CM | POA: Diagnosis not present

## 2021-06-12 DIAGNOSIS — W19XXXA Unspecified fall, initial encounter: Secondary | ICD-10-CM

## 2021-06-12 DIAGNOSIS — S42322A Displaced transverse fracture of shaft of humerus, left arm, initial encounter for closed fracture: Secondary | ICD-10-CM | POA: Diagnosis not present

## 2021-06-12 DIAGNOSIS — S0990XA Unspecified injury of head, initial encounter: Secondary | ICD-10-CM | POA: Insufficient documentation

## 2021-06-12 DIAGNOSIS — Z79899 Other long term (current) drug therapy: Secondary | ICD-10-CM | POA: Insufficient documentation

## 2021-06-12 DIAGNOSIS — S4992XA Unspecified injury of left shoulder and upper arm, initial encounter: Secondary | ICD-10-CM | POA: Diagnosis present

## 2021-06-12 NOTE — ED Provider Notes (Signed)
Emergency Medicine Provider Triage Evaluation Note  Timothy [name] , a 50 y.o. male  was evaluated in triage.  Patient reports complaints of left shoulder pain S/p fall. [place] down a couple steps and injured the left shoulder/arm, having significant pain as well as numbness to the left hand. Has had a few drinks tonight.   Review of Systems  Positive: Arthralgia, numbness Negative: Head injury, LOC  Physical Exam  BP 104/73   Pulse 66   Temp 97.7 F (36.5 C)   Resp 18   SpO2 98%  Gen:   Awake, no distress   Resp:  Normal effort  MSK:   Obvious deformity to left shoulder/proximal humerus w/ TTP. Patient has palpable  left radial pulse- question slightly decreased compared to right radial pulse, < 3 second cap refill to all digits, however left hand cooler to the touch than right hand.   Medical Decision Making  Medically screening exam initiated at 11:41 PM.  Appropriate orders placed.  Ashan Cueva was informed that the remainder of the evaluation will be completed by another provider, this initial triage assessment does not replace that evaluation, and the importance of remaining in the ED until their evaluation is complete.  Shoulder injury, concern for vascular compromise, charge RN Janett Billow updated- patient will be roomed on acute care side, attending made aware, called xray to facilitate imaging- xrays of left shoulder/humerus ordered, may need additional imaging.    Leafy Kindle 06/13/21 [id number]    Quintella Reichert, MD 06/13/21 [phone]

## 2021-06-12 NOTE — ED Notes (Signed)
Pt taken to xray before able to triage

## 2021-06-13 ENCOUNTER — Emergency Department (HOSPITAL_COMMUNITY): Payer: 59

## 2021-06-13 ENCOUNTER — Other Ambulatory Visit: Payer: Self-pay

## 2021-06-13 ENCOUNTER — Encounter (HOSPITAL_COMMUNITY): Payer: Self-pay | Admitting: Emergency Medicine

## 2021-06-13 LAB — COMPREHENSIVE METABOLIC PANEL
ALT: 37 U/L (ref 0–44)
AST: 38 U/L (ref 15–41)
Albumin: 3.9 g/dL (ref 3.5–5.0)
Alkaline Phosphatase: 79 U/L (ref 38–126)
Anion gap: 11 (ref 5–15)
BUN: 9 mg/dL (ref 6–20)
CO2: 23 mmol/L (ref 22–32)
Calcium: 8.4 mg/dL — ABNORMAL LOW (ref 8.9–10.3)
Chloride: 98 mmol/L (ref 98–111)
Creatinine, Ser: 0.71 mg/dL (ref 0.61–1.24)
GFR, Estimated: >60 mL/min (ref >60)
Glucose, Bld: 87 mg/dL (ref 70–99)
Potassium: 3.4 mmol/L — ABNORMAL LOW (ref 3.5–5.1)
Sodium: 132 mmol/L — ABNORMAL LOW (ref 135–145)
Total Bilirubin: 0.2 mg/dL — ABNORMAL LOW (ref 0.3–1.2)
Total Protein: 7.2 g/dL (ref 6.5–8.1)

## 2021-06-13 LAB — ETHANOL: Alcohol, Ethyl (B): 166 mg/dL — ABNORMAL HIGH (ref <10)

## 2021-06-13 LAB — CBC WITH DIFFERENTIAL/PLATELET
Abs Immature Granulocytes: 0.16 10*3/uL — ABNORMAL HIGH (ref 0.00–0.07)
Basophils Absolute: 0.1 10*3/uL (ref 0.0–0.1)
Basophils Relative: 1 %
Eosinophils Absolute: 0 10*3/uL (ref 0.0–0.5)
Eosinophils Relative: 0 %
HCT: 45.3 % (ref 39.0–52.0)
Hemoglobin: 15.7 g/dL (ref 13.0–17.0)
Immature Granulocytes: 1 %
Lymphocytes Relative: 7 %
Lymphs Abs: 1.4 10*3/uL (ref 0.7–4.0)
MCH: 31.9 pg (ref 26.0–34.0)
MCHC: 34.7 g/dL (ref 30.0–36.0)
MCV: 92.1 fL (ref 80.0–100.0)
Monocytes Absolute: 1 10*3/uL (ref 0.1–1.0)
Monocytes Relative: 5 %
Neutro Abs: 16.4 10*3/uL — ABNORMAL HIGH (ref 1.7–7.7)
Neutrophils Relative %: 86 %
Platelets: 251 10*3/uL (ref 150–400)
RBC: 4.92 MIL/uL (ref 4.22–5.81)
RDW: 11.6 % (ref 11.5–15.5)
WBC: 19 10*3/uL — ABNORMAL HIGH (ref 4.0–10.5)
nRBC: 0 % (ref 0.0–0.2)

## 2021-06-13 MED ORDER — SODIUM CHLORIDE 0.9 % IV BOLUS (SEPSIS)
500.0000 mL | Freq: Once | INTRAVENOUS | Status: AC
  Administered 2021-06-13: 500 mL via INTRAVENOUS

## 2021-06-13 MED ORDER — FENTANYL CITRATE PF 50 MCG/ML IJ SOSY
50.0000 ug | PREFILLED_SYRINGE | Freq: Once | INTRAMUSCULAR | Status: AC
  Administered 2021-06-13: 50 ug via INTRAVENOUS
  Filled 2021-06-13: qty 1

## 2021-06-13 MED ORDER — HYDROCODONE-ACETAMINOPHEN 5-325 MG PO TABS: 1.0000 | ORAL_TABLET | Freq: Four times a day (QID) | ORAL | 0 refills | Status: DC | PRN

## 2021-06-13 MED ORDER — HYDROMORPHONE HCL 1 MG/ML IJ SOLN
INTRAMUSCULAR | Status: AC
  Filled 2021-06-13: qty 1

## 2021-06-13 MED ORDER — HYDROMORPHONE HCL 1 MG/ML IJ SOLN
1.0000 mg | Freq: Once | INTRAMUSCULAR | Status: AC
  Administered 2021-06-13: 1 mg via INTRAVENOUS

## 2021-06-13 NOTE — ED Notes (Signed)
Provider and ortho tech at bedside

## 2021-06-13 NOTE — Progress Notes (Signed)
Orthopedic Tech Progress Note Patient Details:  Timothy [name] 1971/01/19 [id number]  Ortho Devices Type of Ortho Device: Shoulder immobilizer, Coapt, Velcro wrist splint Ortho Device/Splint Location: LUE Ortho Device/Splint Interventions: Ordered, Application, Adjustment   Post Interventions Patient Tolerated: Poor Instructions Provided: Adjustment of device, Care of device, Poper ambulation with device  Timothy [name] 06/13/2021, 3:11 AM

## 2021-06-13 NOTE — ED Provider Notes (Signed)
[place] EMERGENCY DEPARTMENT Provider Note   CSN: [id number] Arrival date & time: 06/12/21  2246     History No chief complaint on file.   Timothy [name] is a 50 y.o. male.  The history is provided by the patient and the spouse.  Timothy [name] is a 50 y.o. male who presents to the Emergency Department complaining of fall, arm injury. He presents to the emergency department by private vehicle accompanied by his wife for evaluation of injuries following a fall. He drinks alcohol regularly but drink heavily today and had a fall down the back deck stairs and fell down an entire flight of stairs. He complains of severe pain to the left upper arm. No additional injuries. He has no significant medical problems aside from prostate cancer status post resection. He has chronic back pain. Back pain is at his baseline. No new headache, chest pain, abdominal pain, nausea, vomiting.    Past Medical History:  Diagnosis Date   Anxiety    Hepatitis    age 63.    History of kidney stones    Panic attack    Prostate cancer [facility])     Patient Active Problem List   Diagnosis Date Noted   Prostate cancer ([place]) 10/24/2020   Genetic testing 09/29/2020   Malignant neoplasm of prostate ([place]) 05/13/2020   Obesity, Class I, BMI 30-34.9 07/19/2017   Insomnia 01/20/2016   Depression 01/06/2016   Gastroesophageal reflux disease 11/11/2015   Anxiety 08/29/2011    Past Surgical History:  Procedure Laterality Date   FRACTURE SURGERY Right 2012   rods, screws   LEG SURGERY     LYMPHADENECTOMY Bilateral 10/24/2020   Procedure: LYMPHADENECTOMY, PELVIC;  Surgeon: Janith Lima, MD;  Location: [facility];  Service: Urology;  Laterality: Bilateral;   PROSTATE BIOPSY     ROBOT ASSISTED LAPAROSCOPIC RADICAL PROSTATECTOMY N/A 10/24/2020   Procedure: XI ROBOTIC ASSISTED LAPAROSCOPIC RADICAL PROSTATECTOMY;  Surgeon: Janith Lima, MD;  Location: [facility];  Service: Urology;   Laterality: N/A;  NEEDS 240 MIN   WISDOM TOOTH EXTRACTION         Family History  Adopted: Yes  Problem Relation Age of Onset   Breast cancer Neg Hx    Colon cancer Neg Hx    Pancreatic cancer Neg Hx    Prostate cancer Neg Hx     Social History   Tobacco Use   Smoking status: Never   Smokeless tobacco: Never  Vaping Use   Vaping Use: Never used  Substance Use Topics   Alcohol use: Not Currently   Drug use: Never    Home Medications Prior to Admission medications   Medication Sig Start Date End Date Taking? Authorizing Provider  HYDROcodone-acetaminophen (NORCO/VICODIN) 5-325 MG tablet Take 1 tablet by mouth every 6 (six) hours as needed. 06/13/21  Yes Quintella Reichert, MD  busPIRone (BUSPAR) 10 MG tablet Take 10 mg by mouth daily. 08/16/20   [provider]  docusate sodium (COLACE) 100 MG capsule Take 1 capsule (100 mg total) by mouth 2 (two) times daily. 10/24/20   Debbrah Alar, PA-C  escitalopram (LEXAPRO) 20 MG tablet Take 20 mg by mouth daily. 07/28/20   [provider]  fluticasone (FLONASE) 50 MCG/ACT nasal spray Place 2 sprays into both nostrils daily.    [provider]  sulfamethoxazole-trimethoprim (BACTRIM DS) 800-160 MG tablet Take 1 tablet by mouth 2 (two) times daily. Start the day prior to foley removal appointment 10/24/20  Debbrah Alar, PA-C  traMADol (ULTRAM) 50 MG tablet Take 1-2 tablets (50-100 mg total) by mouth every 6 (six) hours as needed for moderate pain or severe pain. 10/24/20   Debbrah Alar, PA-C    Allergies    Other, Percocet [oxycodone-acetaminophen], Tetanus toxoids, and Shellfish-derived products  Review of Systems   Review of Systems  All other systems reviewed and are negative.  Physical Exam Updated Vital Signs BP 99/64 (BP Location: Right Arm)   Pulse 80   Temp 98.5 F (36.9 C) (Oral)   Resp 18   Ht 5\' 6"  (1.676 m)   Wt 98 kg   SpO2 100%   BMI 34.86 kg/m   Physical Exam Vitals and nursing note  reviewed.  Constitutional:      Appearance: He is well-developed.  HENT:     Head: Normocephalic.     Comments: Small abrasion to the left maxillary region without local tenderness to palpation    Mouth/Throat:     Mouth: Mucous membranes are moist.  Eyes:     Extraocular Movements: Extraocular movements intact.     Pupils: Pupils are equal, round, and reactive to light.  Cardiovascular:     Rate and Rhythm: Normal rate and regular rhythm.     Heart sounds: No murmur heard. Pulmonary:     Effort: Pulmonary effort is normal. No respiratory distress.     Breath sounds: Normal breath sounds.  Abdominal:     Palpations: Abdomen is soft.     Tenderness: There is no abdominal tenderness. There is no guarding or rebound.  Musculoskeletal:     Cervical back: Neck supple. No tenderness.     Comments: There is a deformity and tenderness to the left upper arm. There is no significant tenderness to palpation over the left elbow, wrist and hand. There is tenderness to palpation over the left forearm. He is able to wiggle he digits of the hand. 2+ radial pulses bilaterally. There is slight delay in cap refill to the left hand. There is tenderness to palpation over the mid lumbar spine - patient states this is the area of chronic pain. No overlying ecchymosis.  Skin:    General: Skin is warm and dry.  Neurological:     Mental Status: He is alert and oriented to person, place, and time.  Psychiatric:        Behavior: Behavior normal.    ED Results / Procedures / Treatments   Labs (all labs ordered are listed, but only abnormal results are displayed) Labs Reviewed  COMPREHENSIVE METABOLIC PANEL - Abnormal; Notable for the following components:      Result Value   Sodium 132 (*)    Potassium 3.4 (*)    Calcium 8.4 (*)    Total Bilirubin 0.2 (*)    All other components within normal limits  CBC WITH DIFFERENTIAL/PLATELET - Abnormal; Notable for the following components:   WBC 19.0 (*)     Neutro Abs 16.4 (*)    Abs Immature Granulocytes 0.16 (*)    All other components within normal limits  ETHANOL - Abnormal; Notable for the following components:   Alcohol, Ethyl (B) 166 (*)    All other components within normal limits    EKG None  Radiology DG Chest 1 View  Result Date: 06/13/2021 CLINICAL DATA:  Fall EXAM: CHEST  1 VIEW COMPARISON:  None. FINDINGS: The heart size and mediastinal contours are within normal limits. Both lungs are clear. The visualized skeletal structures are unremarkable.  IMPRESSION: No active disease. Electronically Signed   By: Ulyses Jarred M.D.   On: 06/13/2021 01:53   DG Forearm Left  Result Date: 06/13/2021 CLINICAL DATA:  Fall with left humerus fracture EXAM: LEFT FOREARM - 2 VIEW COMPARISON:  None. FINDINGS: There is no evidence of fracture or other focal bone lesions. Soft tissues are unremarkable. IMPRESSION: Negative. Electronically Signed   By: Ulyses Jarred M.D.   On: 06/13/2021 01:54   CT Head Wo Contrast  Result Date: 06/13/2021 CLINICAL DATA:  Neck trauma, fell down stairs. EXAM: CT HEAD WITHOUT CONTRAST CT CERVICAL SPINE WITHOUT CONTRAST TECHNIQUE: Multidetector CT imaging of the head and cervical spine was performed following the standard protocol without intravenous contrast. Multiplanar CT image reconstructions of the cervical spine were also generated. COMPARISON:  None. FINDINGS: CT HEAD FINDINGS Brain: No acute intracranial hemorrhage, midline shift or mass effect. No extra-axial fluid collection. Gray-white matter differentiation is within normal limits and there is no hydrocephalus. Vascular: No hyperdense vessel or unexpected calcification. Skull: Normal. Negative for fracture or focal lesion. Sinuses/Orbits: Mild mucosal thickening is present in the maxillary sinuses bilaterally. The orbits are unremarkable. Other: None. CT CERVICAL SPINE FINDINGS Alignment: There is mild anterolisthesis at C7-T1. Skull base and vertebrae: No acute  fracture. No primary bone lesion or focal pathologic process. Soft tissues and spinal canal: No prevertebral fluid or swelling. No visible canal hematoma. Disc levels: Intervertebral disc space narrowing, osteophyte formation, sclerosis, and facet arthropathy are present at multiple levels resulting in mild-to-moderate spinal canal and neural foraminal stenosis, most pronounced at C6-C7. Upper chest: Negative. Other: None. IMPRESSION: 1. No acute intracranial hemorrhage. 2. Multilevel degenerative changes in the cervical spine without evidence of acute fracture. Electronically Signed   By: Brett Fairy M.D.   On: 06/13/2021 01:04   CT Cervical Spine Wo Contrast  Result Date: 06/13/2021 CLINICAL DATA:  Neck trauma, fell down stairs. EXAM: CT HEAD WITHOUT CONTRAST CT CERVICAL SPINE WITHOUT CONTRAST TECHNIQUE: Multidetector CT imaging of the head and cervical spine was performed following the standard protocol without intravenous contrast. Multiplanar CT image reconstructions of the cervical spine were also generated. COMPARISON:  None. FINDINGS: CT HEAD FINDINGS Brain: No acute intracranial hemorrhage, midline shift or mass effect. No extra-axial fluid collection. Gray-white matter differentiation is within normal limits and there is no hydrocephalus. Vascular: No hyperdense vessel or unexpected calcification. Skull: Normal. Negative for fracture or focal lesion. Sinuses/Orbits: Mild mucosal thickening is present in the maxillary sinuses bilaterally. The orbits are unremarkable. Other: None. CT CERVICAL SPINE FINDINGS Alignment: There is mild anterolisthesis at C7-T1. Skull base and vertebrae: No acute fracture. No primary bone lesion or focal pathologic process. Soft tissues and spinal canal: No prevertebral fluid or swelling. No visible canal hematoma. Disc levels: Intervertebral disc space narrowing, osteophyte formation, sclerosis, and facet arthropathy are present at multiple levels resulting in  mild-to-moderate spinal canal and neural foraminal stenosis, most pronounced at C6-C7. Upper chest: Negative. Other: None. IMPRESSION: 1. No acute intracranial hemorrhage. 2. Multilevel degenerative changes in the cervical spine without evidence of acute fracture. Electronically Signed   By: Brett Fairy M.D.   On: 06/13/2021 01:04   DG Shoulder Left  Result Date: 06/13/2021 CLINICAL DATA:  Fall, left upper extremity pain. EXAM: LEFT SHOULDER - 2+ VIEW; LEFT HUMERUS - 2+ VIEW COMPARISON:  None. FINDINGS: There is a slightly comminuted, mildly displaced fracture of the mid left humerus with up to 1 cm distraction, depending on positioning. The remaining bony structures are  intact. There is no dislocation. Degenerative changes are present at the acromioclavicular joint. IMPRESSION: Slightly comminuted fracture of the mid left humerus. Electronically Signed   By: Brett Fairy M.D.   On: 06/13/2021 00:06   DG Humerus Left  Result Date: 06/13/2021 CLINICAL DATA:  Fall, left upper extremity pain. EXAM: LEFT SHOULDER - 2+ VIEW; LEFT HUMERUS - 2+ VIEW COMPARISON:  None. FINDINGS: There is a slightly comminuted, mildly displaced fracture of the mid left humerus with up to 1 cm distraction, depending on positioning. The remaining bony structures are intact. There is no dislocation. Degenerative changes are present at the acromioclavicular joint. IMPRESSION: Slightly comminuted fracture of the mid left humerus. Electronically Signed   By: Brett Fairy M.D.   On: 06/13/2021 00:06    Procedures Procedures  SPLINT APPLICATION Date/Time: 8:01 AM Authorized by: Quintella Reichert Consent: Verbal consent obtained. Risks and benefits: risks, benefits and alternatives were discussed Consent given by: patient Splint applied by: orthopedic technician Location details: LUE Splint type: coapt Supplies used: orthoglass, acewrap Post-procedure: The splinted body part was neurovascularly unchanged following the  procedure. Patient tolerance: Patient tolerated the procedure well with no immediate complications.   Medications Ordered in ED Medications  fentaNYL (SUBLIMAZE) injection 50 mcg (50 mcg Intravenous Given 06/13/21 0105)  HYDROmorphone (DILAUDID) injection 1 mg (1 mg Intravenous Given 06/13/21 0237)  sodium chloride 0.9 % bolus 500 mL (0 mLs Intravenous Stopped 06/13/21 0555)    ED Course  I have reviewed the triage vital signs and the nursing notes.  Pertinent labs & imaging results that were available during my care of the patient were reviewed by me and considered in my medical decision making (see chart for details).    MDM Rules/Calculators/A&P                          patient here for evaluation of injuries after he fell down a flight of stairs. He did have more alcohol than his usual. He does have a deformity to the left upper arm. On ED presentation he had asymmetric pulses but on arrival to the ED room patient did have symmetric pulses. Imaging is significant for mid shaft left humerus fracture. He has distal sensation, perfusion and motor function. No evidence of additional injuries on imaging. After receiving Dilaudid patient did become dizzy and orthostatic, which required increased monitoring in the emergency department. On reassessment patient is feeling improved and able to ambulate without difficulties. Discussed with on-call orthopedist, Dr. Alvan Dame regarding patients presenting examination and repeat examination as well as images. Plan to place in co-aptation splint with orthopedics follow-up and return precautions.  Final Clinical Impression(s) / ED Diagnoses Final diagnoses:  Closed displaced transverse fracture of shaft of left humerus, initial encounter  Fall, initial encounter    Rx / DC Orders ED Discharge Orders          Ordered    HYDROcodone-acetaminophen (NORCO/VICODIN) 5-325 MG tablet  Every 6 hours PRN        06/13/21 0602             Quintella Reichert,  MD 06/13/21 [phone]

## 2021-06-13 NOTE — ED Notes (Signed)
Ambulated to restroom  

## 2021-06-13 NOTE — ED Triage Notes (Addendum)
Pt presents to ED POV. Pt c/o L arm injury. Pt reports that he was drinking beer and fell off the top of step of his deck outside. [employer] 7 steps. Pt denies LOC, denies blood thinners, +head injury

## 2021-06-13 NOTE — ED Notes (Signed)
Provider at bedside

## 2021-06-13 NOTE — ED Notes (Signed)
Ambulated without any dizziness at this time

## 2021-06-13 NOTE — ED Notes (Signed)
Patient transported to CT 

## 2021-09-06 HISTORY — PX: ORIF HUMERUS FRACTURE: SHX2126

## 2021-11-20 ENCOUNTER — Encounter (HOSPITAL_COMMUNITY): Payer: Self-pay | Admitting: Orthopedic Surgery

## 2021-11-20 ENCOUNTER — Other Ambulatory Visit: Payer: Self-pay

## 2021-11-20 NOTE — Progress Notes (Signed)
Surgery orders requested via Epic inbox. °

## 2021-11-20 NOTE — Progress Notes (Signed)
COVID Vaccine Completed:  Yes  ? ?Date of COVID positive in last 90 days:  No ? ?PCP - Daisy Lazar ?Cardiologist - N/A ? ?Chest x-ray -  N/A ?EKG -  N/A ?Stress Test -  N/A ?ECHO -  N/A ?Cardiac Cath -  N/A ?Pacemaker/ICD device last checked: ?Spinal Cord Stimulator: ? ?Bowel Prep -  N/A ? ?Sleep Study -  N/A ?CPAP -  ? ?Fasting Blood Sugar -  N/A ?Checks Blood Sugar _____ times a day ? ?Blood Thinner Instructions: N/A ?Aspirin Instructions: ?Last Dose: ? ?Activity level:   Can go up a flight of stairs and perform activities of daily living without stopping and without symptoms of chest pain or shortness of breath. ?   ?Anesthesia review:  N/A ? ?Patient denies shortness of breath, fever, cough and chest pain at PAT appointment (completed over the phone) ? ?Patient verbalized understanding of instructions that were given to them at the PAT appointment. Patient was also instructed that they will need to review over the PAT instructions again at home before surgery.  ?

## 2021-11-20 NOTE — Patient Instructions (Addendum)
DUE TO COVID-19 ONLY TWO VISITORS  (aged 53 and older)  IS ALLOWED TO COME WITH YOU AND STAY IN THE WAITING ROOM ONLY DURING PRE OP AND PROCEDURE.   ?**NO VISITORS ARE ALLOWED IN THE SHORT STAY AREA OR RECOVERY ROOM!!** ? ?IF YOU WILL BE ADMITTED INTO THE HOSPITAL YOU ARE ALLOWED ONLY FOUR SUPPORT PEOPLE DURING VISITATION HOURS ONLY (7 AM -8PM)   ?The support person(s) must pass our screening, gel in and out ?Visitors GUEST BADGE MUST BE WORN VISIBLY  ?One adult visitor may remain with you overnight and MUST be in the room by 8 P.M.  ? ?You are not required to quarantine ?Hand Hygiene often ?Do NOT share personal items ?Notify your provider if you are in close contact with someone who has COVID or you develop fever 100.4 or greater, new onset of sneezing, cough, sore throat, shortness of breath or body aches. ? ?     ? Your procedure is scheduled on:   11-23-21 ? ? Report to [facility] Main Entrance ? ?  Report to admitting at 10:15 AM ? ? Call this number if you have problems the morning of surgery [phone] ? ? Do not eat food :After Midnight. ? ? After Midnight you may have the following liquids until 9:30 AM DAY OF SURGERY ? ?Water ?Black Coffee (sugar ok, NO MILK/CREAM OR CREAMERS)  ?Tea (sugar ok, NO MILK/CREAM OR CREAMERS) regular and decaf                             ?Plain Jell-O (NO RED)                                           ?Fruit ices (not with fruit pulp, NO RED)                                     ?Popsicles (NO RED)                                                                  ?Juice: apple, WHITE grape, WHITE cranberry ?Sports drinks like Gatorade (NO RED) ?Clear broth(vegetable,chicken,beef)             ?  ?  ?       If you have questions, please contact your surgeon?s office. ? ? ?FOLLOW ANY ADDITIONAL PRE OP INSTRUCTIONS YOU RECEIVED FROM YOUR SURGEON'S OFFICE!!! ?  ?  ?Oral Hygiene is also important to reduce your risk of infection.                                    ?Remember  - BRUSH YOUR TEETH THE MORNING OF SURGERY WITH YOUR REGULAR TOOTHPASTE ? ? Do NOT smoke after Midnight ? ? Take these medicines the morning of surgery with A SIP OF WATER:  Buspirone, Escitalopram, Pregabalin ?                  ?  You may not have any metal on your body including jewelry, and body piercing ? ?           Do not wear  lotions, powders, cologne, or deodorant ? ?            Men may shave face and neck. ? ? Do not bring valuables to the hospital. [place]. ? ? Contacts, dentures or bridgework may not be worn into surgery. ? ? Bring small overnight bag day of surgery. ? ?Please read over the following fact sheets you were given: IF YOU HAVE QUESTIONS ABOUT YOUR PRE-OP INSTRUCTIONS PLEASE CALL [place]  ? ?[place] - Preparing for Surgery (use your regular soap, the hospital usually provides the CHG) ?Before surgery, you can play an important role.  Because skin is not sterile, your skin needs to be as free of germs as possible.  You can reduce the number of germs on your skin by washing with CHG (chlorahexidine gluconate) soap before surgery.  CHG is an antiseptic cleaner which kills germs and bonds with the skin to continue killing germs even after washing. ?Please DO NOT use if you have an allergy to CHG or antibacterial soaps.  If your skin becomes reddened/irritated stop using the CHG and inform your nurse when you arrive at Short Stay. ?Do not shave (including legs and underarms) for at least 48 hours prior to the first CHG shower.  You may shave your face/neck. ? ?Please follow these instructions carefully: ? 1.  Shower with CHG Soap the night before surgery and the  morning of surgery. ? 2.  If you choose to wash your hair, wash your hair first as usual with your normal  shampoo. ? 3.  After you shampoo, rinse your hair and body thoroughly to remove the shampoo.                            ? 4.  Use CHG as you would any other liquid soap.   You can apply chg directly to the skin and wash.  Gently with a scrungie or clean washcloth. ? 5.  Apply the CHG Soap to your body ONLY FROM THE NECK DOWN.   Do   not use on face/ open      ?                     Wound or open sores. Avoid contact with eyes, ears mouth and   genitals (private parts).  ?                     Production manager,  Genitals (private parts) with your normal soap. ?            6.  Wash thoroughly, paying special attention to the area where your    surgery  will be performed. ? 7.  Thoroughly rinse your body with warm water from the neck down. ? 8.  DO NOT shower/wash with your normal soap after using and rinsing off the CHG Soap. ?               9.  Pat yourself dry with a clean towel. ?           10.  Wear clean pajamas. ?           11.  Place clean sheets on your bed the  night of your first shower and do not  sleep with pets. ?Day of Surgery : ?Do not apply any lotions/deodorants the morning of surgery.  Please wear clean clothes to the hospital/surgery center. ? ?FAILURE TO FOLLOW THESE INSTRUCTIONS MAY RESULT IN THE CANCELLATION OF YOUR SURGERY ? ?PATIENT SIGNATURE_________________________________ ? ?NURSE SIGNATURE__________________________________ ? ?________________________________________________________________________  ?  ?

## 2021-11-22 NOTE — Anesthesia Preprocedure Evaluation (Addendum)
Anesthesia Evaluation  Patient identified by MRN, date of birth, ID band Patient awake    Reviewed: Allergy & Precautions, NPO status , Patient's Chart, lab work & pertinent test results  Airway Mallampati: II  TM Distance: >3 FB Neck ROM: Full    Dental no notable dental hx. (+) Teeth Intact, Dental Advisory Given   Pulmonary former smoker,    Pulmonary exam normal breath sounds clear to auscultation       Cardiovascular Exercise Tolerance: Good negative cardio ROS Normal cardiovascular exam Rhythm:Regular Rate:Normal     Neuro/Psych PSYCHIATRIC DISORDERS Anxiety Depression    GI/Hepatic GERD  ,(+) Hepatitis - (- Remote Hx)  Endo/Other    Renal/GU      Musculoskeletal   Abdominal (+) + obese (BMI 33.9),   Peds  Hematology   Anesthesia Other Findings   Reproductive/Obstetrics                            Anesthesia Physical Anesthesia Plan  ASA: 2  Anesthesia Plan: General and Regional   Post-op Pain Management: Regional block*   Induction: Intravenous  PONV Risk Score and Plan: 3 and Midazolam, Treatment may vary due to age or medical condition and Ondansetron  Airway Management Planned: Oral ETT  Additional Equipment: None  Intra-op Plan:   Post-operative Plan: Extubation in OR  Informed Consent: I have reviewed the patients History and Physical, chart, labs and discussed the procedure including the risks, benefits and alternatives for the proposed anesthesia with the patient or authorized representative who has indicated his/her understanding and acceptance.     Dental advisory given  Plan Discussed with: CRNA and Anesthesiologist  Anesthesia Plan Comments: (GA w L ISB)       Anesthesia Quick Evaluation

## 2021-11-23 ENCOUNTER — Ambulatory Visit (HOSPITAL_COMMUNITY): Payer: BC Managed Care – PPO | Admitting: Anesthesiology

## 2021-11-23 ENCOUNTER — Other Ambulatory Visit: Payer: Self-pay

## 2021-11-23 ENCOUNTER — Ambulatory Visit (HOSPITAL_COMMUNITY)
Admission: RE | Admit: 2021-11-23 | Discharge: 2021-11-23 | Disposition: A | Discharge status: Home / Self Care | Payer: BC Managed Care – PPO | Attending: Orthopedic Surgery | Admitting: Orthopedic Surgery

## 2021-11-23 ENCOUNTER — Encounter (HOSPITAL_COMMUNITY): Payer: Self-pay | Admitting: Orthopedic Surgery

## 2021-11-23 ENCOUNTER — Ambulatory Visit (HOSPITAL_COMMUNITY): Payer: BC Managed Care – PPO

## 2021-11-23 ENCOUNTER — Encounter (HOSPITAL_COMMUNITY)
Admission: RE | Disposition: A | Discharge status: Home / Self Care | Payer: Self-pay | Source: Home / Self Care | Attending: Orthopedic Surgery

## 2021-11-23 DIAGNOSIS — Z87891 Personal history of nicotine dependence: Secondary | ICD-10-CM | POA: Insufficient documentation

## 2021-11-23 DIAGNOSIS — S42302A Unspecified fracture of shaft of humerus, left arm, initial encounter for closed fracture: Secondary | ICD-10-CM | POA: Insufficient documentation

## 2021-11-23 DIAGNOSIS — F32A Depression, unspecified: Secondary | ICD-10-CM | POA: Diagnosis not present

## 2021-11-23 DIAGNOSIS — K769 Liver disease, unspecified: Secondary | ICD-10-CM

## 2021-11-23 DIAGNOSIS — K219 Gastro-esophageal reflux disease without esophagitis: Secondary | ICD-10-CM

## 2021-11-23 DIAGNOSIS — E669 Obesity, unspecified: Secondary | ICD-10-CM

## 2021-11-23 DIAGNOSIS — S42302P Unspecified fracture of shaft of humerus, left arm, subsequent encounter for fracture with malunion: Secondary | ICD-10-CM

## 2021-11-23 DIAGNOSIS — G4700 Insomnia, unspecified: Secondary | ICD-10-CM

## 2021-11-23 DIAGNOSIS — Z1379 Encounter for other screening for genetic and chromosomal anomalies: Secondary | ICD-10-CM

## 2021-11-23 DIAGNOSIS — F419 Anxiety disorder, unspecified: Secondary | ICD-10-CM | POA: Diagnosis not present

## 2021-11-23 DIAGNOSIS — C61 Malignant neoplasm of prostate: Secondary | ICD-10-CM

## 2021-11-23 DIAGNOSIS — W19XXXA Unspecified fall, initial encounter: Secondary | ICD-10-CM | POA: Insufficient documentation

## 2021-11-23 DIAGNOSIS — S42292P Other displaced fracture of upper end of left humerus, subsequent encounter for fracture with malunion: Secondary | ICD-10-CM

## 2021-11-23 HISTORY — PX: ORIF HUMERUS FRACTURE: SHX2126

## 2021-11-23 HISTORY — DX: Gastro-esophageal reflux disease without esophagitis: K21.9

## 2021-11-23 LAB — COMPREHENSIVE METABOLIC PANEL
ALT: 29 U/L (ref 0–44)
AST: 24 U/L (ref 15–41)
Albumin: 3.8 g/dL (ref 3.5–5.0)
Alkaline Phosphatase: 118 U/L (ref 38–126)
Anion gap: 7 (ref 5–15)
BUN: 18 mg/dL (ref 6–20)
CO2: 23 mmol/L (ref 22–32)
Calcium: 8.8 mg/dL — ABNORMAL LOW (ref 8.9–10.3)
Chloride: 107 mmol/L (ref 98–111)
Creatinine, Ser: 0.61 mg/dL (ref 0.61–1.24)
GFR, Estimated: >60 mL/min (ref >60)
Glucose, Bld: 95 mg/dL (ref 70–99)
Potassium: 4 mmol/L (ref 3.5–5.1)
Sodium: 137 mmol/L (ref 135–145)
Total Bilirubin: 0.8 mg/dL (ref 0.3–1.2)
Total Protein: 7.1 g/dL (ref 6.5–8.1)

## 2021-11-23 LAB — CBC
HCT: 43.8 % (ref 39.0–52.0)
Hemoglobin: 15.2 g/dL (ref 13.0–17.0)
MCH: 32.1 pg (ref 26.0–34.0)
MCHC: 34.7 g/dL (ref 30.0–36.0)
MCV: 92.4 fL (ref 80.0–100.0)
Platelets: 294 10*3/uL (ref 150–400)
RBC: 4.74 MIL/uL (ref 4.22–5.81)
RDW: 11.9 % (ref 11.5–15.5)
WBC: 8 10*3/uL (ref 4.0–10.5)
nRBC: 0 % (ref 0.0–0.2)

## 2021-11-23 LAB — TYPE AND SCREEN
ABO/RH(D): O POS
Antibody Screen: NEGATIVE

## 2021-11-23 SURGERY — OPEN REDUCTION INTERNAL FIXATION (ORIF) HUMERAL SHAFT FRACTURE
Anesthesia: General | Site: Arm Upper | Laterality: Left

## 2021-11-23 MED ORDER — ONDANSETRON HCL 4 MG/2ML IJ SOLN: 4.0000 mg | Freq: Once | INTRAMUSCULAR | Status: DC | PRN

## 2021-11-23 MED ORDER — AMISULPRIDE (ANTIEMETIC) 5 MG/2ML IV SOLN: 10.0000 mg | Freq: Once | INTRAVENOUS | Status: DC | PRN

## 2021-11-23 MED ORDER — FENTANYL CITRATE (PF) 100 MCG/2ML IJ SOLN
INTRAMUSCULAR | Status: AC
  Filled 2021-11-23: qty 2

## 2021-11-23 MED ORDER — BUPIVACAINE-EPINEPHRINE (PF) 0.5% -1:200000 IJ SOLN
INTRAMUSCULAR | Status: AC
  Filled 2021-11-23: qty 30

## 2021-11-23 MED ORDER — CHLORHEXIDINE GLUCONATE 0.12 % MT SOLN
15.0000 mL | Freq: Once | OROMUCOSAL | Status: AC
  Administered 2021-11-23: 15 mL via OROMUCOSAL

## 2021-11-23 MED ORDER — CEFAZOLIN SODIUM-DEXTROSE 2-4 GM/100ML-% IV SOLN
2.0000 g | INTRAVENOUS | Status: AC
  Administered 2021-11-23: 2 g via INTRAVENOUS
  Filled 2021-11-23: qty 100

## 2021-11-23 MED ORDER — TRANEXAMIC ACID 1000 MG/10ML IV SOLN: 1000.0000 mg | INTRAVENOUS | Status: DC

## 2021-11-23 MED ORDER — TRANEXAMIC ACID-NACL 1000-0.7 MG/100ML-% IV SOLN
1000.0000 mg | INTRAVENOUS | Status: AC
  Administered 2021-11-23: 1000 mg via INTRAVENOUS
  Filled 2021-11-23: qty 100

## 2021-11-23 MED ORDER — LIDOCAINE 2% (20 MG/ML) 5 ML SYRINGE
INTRAMUSCULAR | Status: DC | PRN
Start: 2021-11-23 — End: 2021-11-23
  Administered 2021-11-23: 80 mg via INTRAVENOUS

## 2021-11-23 MED ORDER — DEXAMETHASONE SODIUM PHOSPHATE 10 MG/ML IJ SOLN
INTRAMUSCULAR | Status: DC | PRN
  Administered 2021-11-23: 10 mg via INTRAVENOUS

## 2021-11-23 MED ORDER — MIDAZOLAM HCL 2 MG/2ML IJ SOLN
INTRAMUSCULAR | Status: AC
Start: 2021-11-23 — End: 2021-11-23
  Administered 2021-11-23: 2 mg via INTRAVENOUS
  Filled 2021-11-23: qty 2

## 2021-11-23 MED ORDER — ROCURONIUM BROMIDE 10 MG/ML (PF) SYRINGE
PREFILLED_SYRINGE | INTRAVENOUS | Status: DC | PRN
  Administered 2021-11-23: 30 mg via INTRAVENOUS
  Administered 2021-11-23: 70 mg via INTRAVENOUS

## 2021-11-23 MED ORDER — VANCOMYCIN HCL 1000 MG IV SOLR
INTRAVENOUS | Status: AC
  Filled 2021-11-23: qty 20

## 2021-11-23 MED ORDER — SUGAMMADEX SODIUM 200 MG/2ML IV SOLN
INTRAVENOUS | Status: DC | PRN
Start: 2021-11-23 — End: 2021-11-23
  Administered 2021-11-23: 200 mg via INTRAVENOUS

## 2021-11-23 MED ORDER — HYDROMORPHONE HCL 1 MG/ML IJ SOLN: 0.2500 mg | INTRAMUSCULAR | Status: DC | PRN

## 2021-11-23 MED ORDER — FENTANYL CITRATE PF 50 MCG/ML IJ SOSY
PREFILLED_SYRINGE | INTRAMUSCULAR | Status: AC
  Administered 2021-11-23: 100 ug via INTRAVENOUS
  Filled 2021-11-23: qty 2

## 2021-11-23 MED ORDER — ORAL CARE MOUTH RINSE: 15.0000 mL | Freq: Once | OROMUCOSAL | Status: AC

## 2021-11-23 MED ORDER — HYDROCODONE-ACETAMINOPHEN 10-325 MG PO TABS: 1.0000 | ORAL_TABLET | Freq: Four times a day (QID) | ORAL | 0 refills | Status: DC | PRN

## 2021-11-23 MED ORDER — 0.9 % SODIUM CHLORIDE (POUR BTL) OPTIME
TOPICAL | Status: DC | PRN
  Administered 2021-11-23: 650 mL

## 2021-11-23 MED ORDER — PHENYLEPHRINE 80 MCG/ML (10ML) SYRINGE FOR IV PUSH (FOR BLOOD PRESSURE SUPPORT)
PREFILLED_SYRINGE | INTRAVENOUS | Status: AC
  Filled 2021-11-23: qty 10

## 2021-11-23 MED ORDER — OXYCODONE HCL 5 MG/5ML PO SOLN: 5.0000 mg | Freq: Once | ORAL | Status: DC | PRN

## 2021-11-23 MED ORDER — PROPOFOL 10 MG/ML IV BOLUS
INTRAVENOUS | Status: DC | PRN
  Administered 2021-11-23: 170 mg via INTRAVENOUS

## 2021-11-23 MED ORDER — MIDAZOLAM HCL 2 MG/2ML IJ SOLN: 1.0000 mg | INTRAMUSCULAR | Status: DC

## 2021-11-23 MED ORDER — FENTANYL CITRATE (PF) 100 MCG/2ML IJ SOLN
INTRAMUSCULAR | Status: DC | PRN
  Administered 2021-11-23: 100 ug via INTRAVENOUS

## 2021-11-23 MED ORDER — PHENYLEPHRINE 80 MCG/ML (10ML) SYRINGE FOR IV PUSH (FOR BLOOD PRESSURE SUPPORT)
PREFILLED_SYRINGE | INTRAVENOUS | Status: DC | PRN
  Administered 2021-11-23: 80 ug via INTRAVENOUS
  Administered 2021-11-23 (×2): 40 ug via INTRAVENOUS
  Administered 2021-11-23 (×2): 80 ug via INTRAVENOUS

## 2021-11-23 MED ORDER — ONDANSETRON HCL 4 MG PO TABS: 4.0000 mg | ORAL_TABLET | Freq: Three times a day (TID) | ORAL | 0 refills | Status: DC | PRN

## 2021-11-23 MED ORDER — FENTANYL CITRATE PF 50 MCG/ML IJ SOSY: 50.0000 ug | PREFILLED_SYRINGE | INTRAMUSCULAR | Status: DC

## 2021-11-23 MED ORDER — KETOROLAC TROMETHAMINE 30 MG/ML IJ SOLN
INTRAMUSCULAR | Status: DC | PRN
  Administered 2021-11-23: 30 mg via INTRAVENOUS

## 2021-11-23 MED ORDER — ACETAMINOPHEN 10 MG/ML IV SOLN: 1000.0000 mg | Freq: Once | INTRAVENOUS | Status: DC | PRN

## 2021-11-23 MED ORDER — CYCLOBENZAPRINE HCL 10 MG PO TABS: 10.0000 mg | ORAL_TABLET | Freq: Three times a day (TID) | ORAL | 1 refills | Status: DC | PRN

## 2021-11-23 MED ORDER — ONDANSETRON HCL 4 MG/2ML IJ SOLN
INTRAMUSCULAR | Status: DC | PRN
Start: 2021-11-23 — End: 2021-11-23
  Administered 2021-11-23: 4 mg via INTRAVENOUS

## 2021-11-23 MED ORDER — OXYCODONE HCL 5 MG PO TABS: 5.0000 mg | ORAL_TABLET | Freq: Once | ORAL | Status: DC | PRN

## 2021-11-23 MED ORDER — LACTATED RINGERS IV SOLN: INTRAVENOUS | Status: DC

## 2021-11-23 SURGICAL SUPPLY — 45 items
BAG COUNTER SPONGE SURGICOUNT (BAG) ×1 IMPLANT
BAG ZIPLOCK 12X15 (MISCELLANEOUS) ×2 IMPLANT
BIT DRILL CANN QC 4.3X180 (BIT) IMPLANT
BIT DRILL Q/COUPLING 1 (BIT) ×1 IMPLANT
CHLORAPREP W/TINT 26 (MISCELLANEOUS) ×2 IMPLANT
COVER SURGICAL LIGHT HANDLE (MISCELLANEOUS) ×2 IMPLANT
DERMABOND ADVANCED (GAUZE/BANDAGES/DRESSINGS) ×1
DERMABOND ADVANCED .7 DNX12 (GAUZE/BANDAGES/DRESSINGS) ×1 IMPLANT
DRAPE C-ARM 42X120 X-RAY (DRAPES) ×2 IMPLANT
DRAPE C-ARMOR (DRAPES) IMPLANT
DRAPE ORTHO SPLIT 77X108 STRL (DRAPES) ×2
DRAPE SURG ORHT 6 SPLT 77X108 (DRAPES) ×2 IMPLANT
DRAPE U-SHAPE 47X51 STRL (DRAPES) ×2 IMPLANT
DRILL BIT 4.3MM (BIT) ×2
DRSG AQUACEL AG ADV 3.5X14 (GAUZE/BANDAGES/DRESSINGS) ×1 IMPLANT
ELECT REM PT RETURN 15FT ADLT (MISCELLANEOUS) ×2 IMPLANT
GLOVE BIO SURGEON STRL SZ7.5 (GLOVE) ×2 IMPLANT
GLOVE BIO SURGEON STRL SZ8 (GLOVE) ×2 IMPLANT
GLOVE SS BIOGEL STRL SZ 7 (GLOVE) ×1 IMPLANT
GLOVE SS BIOGEL STRL SZ 7.5 (GLOVE) ×1 IMPLANT
GLOVE SUPERSENSE BIOGEL SZ 7 (GLOVE) ×1
GLOVE SUPERSENSE BIOGEL SZ 7.5 (GLOVE) ×1
GRAFT DBM PARTICULATE CANC 10 (Bone Implant) IMPLANT
GRAFT IC CHAMBER MED (Bone Implant) ×2 IMPLANT
KIT BASIN OR (CUSTOM PROCEDURE TRAY) ×2 IMPLANT
KIT TURNOVER KIT A (KITS) ×1 IMPLANT
MANIFOLD NEPTUNE II (INSTRUMENTS) ×2 IMPLANT
NS IRRIG 1000ML POUR BTL (IV SOLUTION) ×2 IMPLANT
PACK SHOULDER (CUSTOM PROCEDURE TRAY) ×2 IMPLANT
PLATE LOCKING 10 HOLE (Plate) ×1 IMPLANT
PROTECTOR NERVE ULNAR (MISCELLANEOUS) ×1 IMPLANT
PUTTY DBM STAGRAFT PLUS 10CC (Putty) ×1 IMPLANT
SCREW CORTEX ST 4.5X28 (Screw) ×2 IMPLANT
SCREW LOCK ST T25 SD 5.0X30 (Screw) ×1 IMPLANT
SCREW LOCKING 5.0MM 26MM (Screw) ×1 IMPLANT
SCREW LOCKING 5.0MM 28MM (Screw) ×4 IMPLANT
SLING ARM IMMOBILIZER LRG (SOFTGOODS) ×1 IMPLANT
SLING ARM IMMOBILIZER MED (SOFTGOODS) IMPLANT
SUT MNCRL AB 3-0 PS2 18 (SUTURE) ×2 IMPLANT
SUT MON AB 2-0 CT1 36 (SUTURE) ×3 IMPLANT
SUT VIC AB 1 CT1 36 (SUTURE) ×4 IMPLANT
TOWEL OR 17X26 10 PK STRL BLUE (TOWEL DISPOSABLE) ×3 IMPLANT
TOWEL OR NON WOVEN STRL DISP B (DISPOSABLE) ×2 IMPLANT
WATER STERILE IRR 1000ML POUR (IV SOLUTION) ×2 IMPLANT
YANKAUER SUCT BULB TIP 10FT TU (MISCELLANEOUS) ×2 IMPLANT

## 2021-11-23 NOTE — H&P (Signed)
Timothy [name]    Chief Complaint: recurrent left humeral shaft fracture HPI: The patient is a 51 y.o. male status post an original injury back on June 12, 2021 at which time he slipped and fell down some stairs at home on his back deck sustaining a transverse left midshaft humerus fracture.  He underwent initial course of conservative management but unfortunately went on to delayed/nonunion with continued severe instability at the fracture site.  He was ultimately taken to surgery 09/02/2021 where he performed an open reduction and internal fixation with allograft bone grafting.  He had been doing very well until an unfortunate fall this past weekend at which time he landed directly on the flexed left elbow sustaining a longitudinal full weight landing on the left arm with immediate complaints of pain and deformity with sensations of instability in the mid humeral region.  He was seen in the office this past Monday where x-rays showed that there had been loss of fixation with the proximal screws have not been ripped out of the bone with marked angular deformity and displacement at the fracture site.  He is now brought to the operating room for planned revision ORIF.    Past Medical History:  Diagnosis Date   Anxiety    GERD (gastroesophageal reflux disease)    Hepatitis    age 16.    History of kidney stones    Panic attack    Prostate cancer Timothy [name])       Past Surgical History:  Procedure Laterality Date   FRACTURE SURGERY Right 2012   rods, screws   LEG SURGERY     LYMPHADENECTOMY Bilateral 10/24/2020   Procedure: LYMPHADENECTOMY, PELVIC;  Surgeon: Timothy Lima, MD;  Location: [facility];  Service: Urology;  Laterality: Bilateral;   ORIF HUMERUS FRACTURE Left 09/2021   PROSTATE BIOPSY     ROBOT ASSISTED LAPAROSCOPIC RADICAL PROSTATECTOMY N/A 10/24/2020   Procedure: XI ROBOTIC ASSISTED LAPAROSCOPIC RADICAL PROSTATECTOMY;  Surgeon: Timothy Lima, MD;  Location: [facility];   Service: Urology;  Laterality: N/A;  NEEDS 240 MIN   WISDOM TOOTH EXTRACTION      Family History  Adopted: Yes  Problem Relation Age of Onset   Breast cancer Neg Hx    Colon cancer Neg Hx    Pancreatic cancer Neg Hx    Prostate cancer Neg Hx     Social History:  reports that he has quit smoking. His smoking use included cigarettes. He has a 31.50 pack-year smoking history. He has never used smokeless tobacco. He reports that he does not currently use alcohol. He reports that he does not use drugs.  BMI: Estimated body mass index is 33.89 kg/m as calculated from the following:   Height as of this encounter: '5\' 6"'$  (1.676 m).   Weight as of this encounter: 95.3 kg.  Lab Results  Component Value Date   ALBUMIN 3.8 11/23/2021   Diabetes: Patient does not have a diagnosis of diabetes.     Smoking Status: Social History   Tobacco Use  Smoking Status Former   Packs/day: 1.50   Years: 21.00   Pack years: 31.50   Types: Cigarettes  Smokeless Tobacco Never   The patient is not currently a tobacco user. Counseling given: Not Answered      Medications Prior to Admission  Medication Sig Dispense Refill   atorvastatin (LIPITOR) 20 MG tablet Take 20 mg by mouth at bedtime.     busPIRone (BUSPAR) 10 MG tablet Take 10 mg  by mouth daily.     escitalopram (LEXAPRO) 20 MG tablet Take 20 mg by mouth in the morning.     fluticasone (FLONASE) 50 MCG/ACT nasal spray Place 1 spray into [name] nostrils at bedtime.     Multiple Vitamins-Minerals (MULTIVITAMIN WITH MINERALS) tablet Take 1 tablet by mouth daily.     naproxen (NAPROSYN) 500 MG tablet Take 500 mg by mouth 2 (two) times daily with a meal.     pregabalin (LYRICA) 25 MG capsule Take 25-50 mg by mouth See admin instructions. Take 25 mg in the evening and 50 mg at bedtime     docusate sodium (COLACE) 100 MG capsule Take 1 capsule (100 mg total) by mouth 2 (two) times daily. (Patient not taking: Reported on 11/20/2021)     traMADol  (ULTRAM) 50 MG tablet Take 1-2 tablets (50-100 mg total) by mouth every 6 (six) hours as needed for moderate pain or severe pain. (Patient not taking: Reported on 11/20/2021) 20 tablet 0     Physical Exam: Inspection of the left upper extremity demonstrates that his previous surgical incision is well-healed.  Compartments are all soft.  He has diffuse tenderness palpation.  He remains neurovascular intact in left upper extremity.  Radiographs show that the proximal 3 screws have been pulled out of the humeral shaft with marked varus alignment at the fracture site.  Vitals  Temp:  [98.6 F (37 C)] 98.6 F (37 C) (05/18 1021) Pulse Rate:  [69-78] 71 (05/18 1121) Resp:  [7-18] 11 (05/18 1121) BP: (123-126)/(80-90) 123/80 (05/18 1111) SpO2:  [91 %-100 %] 91 % (05/18 1121) Weight:  [95.3 kg] 95.3 kg (05/18 1021)  Assessment/Plan  Impression: recurrent left humeral shaft fracture  Plan of Action: Procedure(s): Revision open reduction internal fixation left humeral shaft fracture with allograft bone graft  Timothy [name] M Timothy [name] 11/23/2021, 11:47 AM Contact # [phone]

## 2021-11-23 NOTE — Op Note (Signed)
11/23/2021  2:56 PM  PATIENT:   Timothy [name]  51 y.o. male  PRE-OPERATIVE DIAGNOSIS:  recurrent left humeral shaft fracture  POST-OPERATIVE DIAGNOSIS: Same  PROCEDURE: Revision open reduction and internal fixation of left humeral shaft fracture with removal of previous hardware and placement of new fixation hardware, additionally with application of allograft bone graft  SURGEON:  Devan Babino, Metta Clines M.D.  ASSISTANTS: Jenetta Loges, PA-C  ANESTHESIA:   General endotracheal and interscalene block with Exparel  EBL: 200 cc  SPECIMEN: None  Drains: None   PATIENT DISPOSITION:  PACU - hemodynamically stable.    PLAN OF CARE: Discharge to home after PACU  Brief history:  Patient is a 51 year old gentleman who sustained an initial transversed midshaft left humerus fracture back in early December 2022.  Initial conservative management was attempted but he continued to have significant pain as well as instability at the fracture site with no radiographic evidence of healing after 2 months.  He was subsequently taken to surgery in late February 2023 where I performed an open reduction and fixation with allograft bone grafting.  Patient reports that he been doing very well until unfortunate this past weekend he is tripped and fell landing directly on the extended left upper extremity landing on the flexed elbow with immediate plaints of severe left arm pain with subsequent deformity and instability.  On evaluation in the office earlier this week he was found to have gross instability about the mid humeral region with diffuse swelling marked tenderness.  Remained neurovascular intact with a well-healed surgical incision.  Radiographs showed unfortunately a recurrent fracture with avulsion of the screws from the humeral shaft proximally.  He is now brought to the operating room for planned revision ORIF.  Preoperatively, I counseled the patient regarding treatment options and risks  versus benefits thereof.  Possible surgical complications were all reviewed including potential for bleeding, infection, neurovascular injury, persistent pain, loss of motion, anesthetic complication, failure of the implant, malunion, nonunion, and possible need for additional surgery. They understand and accept and agrees with our planned procedure.   Procedure in detail:  After undergoing routine preop evaluation the patient received prophylactic antibiotics and interscalene block with Exparel was established in the holding area by the anesthesia department.  Brought to the operating room placed supine on the operating table and underwent the smooth induction of a general endotracheal anesthesia.  Patient was positioned on a radiolucent table and fluoroscopic imaging brought in to confirm visualization of the left humerus could be achieved appropriately.  The left shoulder girdle left upper extremity was then sterilely prepped and draped in standard fashion.  Timeout was called.  A long anterior Mallie Mussel approach was made to the left humerus through the previous incision with extension proximally and distally in anticipation of the need to place a longer plate.  Dissection carried down through the deltopectoral interval proximally and then distally the fascia was divided the biceps were reflected and retracted medially and the underlying brachialis was then split in the midline through the previous dissection plane allowing exposure of the anterolateral humeral shaft and the previously placed hardware which had displaced off the proximal segment.  Subperiosteal dissection was then used to expose the humeral shaft anterolaterally [name] proximally and distally.  Plate was exposed and the proximal screws were removed.  We then assessed the position of the fracture and overall quality of the bone and felt that an additional hole distally and 2 additional holes proximally would give [place] an appropriate  construct with  stability.  This point we then selected the 10 hole plate which we did contour with the plate bender to allow a slight convex of the the fracture site in the very slight concavity more proximally to accommodate the proximal humeral metaphyseal flare.  Provisional positioning showed good alignment and good positioning.  At this point we then removed the distal screws remove the previously placed plate and used those screws to provisionally fix the new plate and this did give [place] 1 additional hole distally which when we were satisfied with the overall position alignment we went ahead and realign the fracture site used a clamp proximally to fasten the plate to the bone used the previously placed screws distally to act as an internal clamp and went ahead and positioned the most distal screw using a lag compression technique.  We then in a similar fashion performed a compression screw fixation proximally which gave [place] excellent compression of the fracture site.  At this point the balance of the screw holes were filled with locking screws all of which achieved excellent purchase and fixation and did also all have a slightly different trajectory from the previously placed nonlocking screws.  I should mention that prior to the terminal fixation we did open up the humeral medullary canal using a curette and then excised soft tissue from around the fracture site to allow placement of the bone to bone graft.  Once her fixation was achieved we used fluoroscopic imaging to confirm satisfactory alignment and good position of the hardware all of which was much to our satisfaction.  At this point we further debrided around the fracture side and pack 10 cc of DBM stay graft bone graft as well as 10 cc of cortical cancellous chips mixed into a slurry and passed disc globally about the fracture site.  Prior to the placement of the bone graft the wound was copiously irrigated.  Final hemostasis was obtained.  This point then the deep  soft tissue planes was closed with a running #1 Vicryl suture to allow the soft tissue envelope intimately around the bone graft and covering the plate.  The deep fascial layer was then closed with a series of interrupted #1 Vicryl sutures.  2-0 Monocryl used to close the subcu layer and intracuticular 3 Monocryl for the skin followed by Dermabond and Aquacel dressing.  Left arm was then placed into a sling.  The patient was awakened, extubated, and taken to the recovery room in stable condition.  Jenetta Loges, PA-C was utilized as an Environmental consultant throughout this case, essential for help with positioning the patient, positioning extremity, tissue manipulation, implantation of the prosthesis, suture management, wound closure, and intraoperative decision-making.  Marin Shutter MD   Contact # [phone]

## 2021-11-23 NOTE — Transfer of Care (Signed)
Immediate Anesthesia Transfer of Care Note  Patient: Timothy [name]  Procedure(s) Performed: Revision open reduction internal fixation left humeral shaft fracture with allograft bone graft (Left: Arm Upper)  Patient Location: PACU  Anesthesia Type:GA combined with regional for post-op pain  Level of Consciousness: drowsy  Airway & Oxygen Therapy: Patient Spontanous Breathing and Patient connected to face mask oxygen  Post-op Assessment: Report given to RN and Post -op Vital signs reviewed and stable  Post vital signs: Reviewed and stable  Last Vitals:  Vitals Value Taken Time  BP 108/73 11/23/21 1524  Temp    Pulse 73 11/23/21 1526  Resp 21 11/23/21 1526  SpO2 100 % 11/23/21 1526  Vitals shown include unvalidated device data.  Last Pain:  Vitals:   11/23/21 1021  TempSrc: Oral  PainSc: 4       Patients Stated Pain Goal: 4 (09/20/92 5859)  Complications: No notable events documented.

## 2021-11-23 NOTE — Discharge Instructions (Signed)
 Kevin M. Supple, M.D., F.A.A.O.S. Orthopaedic Surgery Specializing in Arthroscopic and Reconstructive Surgery of the Shoulder [phone] [address] - [place], [place] [address] - Fax [phone]   POST-OP TOTAL SHOULDER REPLACEMENT INSTRUCTIONS  1. Follow up in the office for your first post-op appointment 10-14 days from the date of your surgery. If you do not already have a scheduled appointment, our office will contact you to schedule.  2. The bandage over your incision is waterproof. You may begin showering with this dressing on. You may leave this dressing on until first follow up appointment within 2 weeks. We prefer you leave this dressing in place until follow up however after 5-7 days if you are having itching or skin irritation and would like to remove it you may do so. Go slow and tug at the borders gently to break the bond the dressing has with the skin. At this point if there is no drainage it is okay to go without a bandage or you may cover it with a light guaze and tape. You can also expect significant bruising around your shoulder that will drift down your arm and into your chest wall. This is very normal and should resolve over several days.   3. Wear your sling/immobilizer at all times except to perform the exercises below or to occasionally let your arm dangle by your side to stretch your elbow. You also need to sleep in your sling immobilizer until instructed otherwise. It is ok to remove your sling if you are sitting in a controlled environment and allow your arm to rest in a position of comfort by your side or on your lap with pillows to give your neck and skin a break from the sling. You may remove it to allow arm to dangle by side to shower. If you are up walking around and when you go to sleep at night you need to wear it.  4. Range of motion to your elbow, wrist, and hand are encouraged 3-5 times daily. Exercise to your hand and fingers helps to reduce  swelling you may experience.   5. Prescriptions for a pain medication and a muscle relaxant are provided for you. It is recommended that if you are experiencing pain that you pain medication alone is not controlling, add the muscle relaxant along with the pain medication which can give additional pain relief. The first 1-2 days is generally the most severe of your pain and then should gradually decrease. As your pain lessens it is recommended that you decrease your use of the pain medications to an "as needed basis'" only and to always comply with the recommended dosages of the pain medications.  6. Pain medications can produce constipation along with their use. If you experience this, the use of an over the counter stool softener or laxative daily is recommended.   7. For additional questions or concerns, please do not hesitate to call the office. If after hours there is an answering service to forward your concerns to the physician on call.  8.Pain control following an exparel block  To help control your post-operative pain you received a nerve block  performed with Exparel which is a long acting anesthetic (numbing agent) which can provide pain relief and sensations of numbness (and relief of pain) in the operative shoulder and arm for up to 3 days. Sometimes it provides mixed relief, meaning you may still have numbness in certain areas of the arm but can still be able to   move  parts of that arm, hand, and fingers. We recommend that your prescribed pain medications  be used as needed. We do not feel it is necessary to "pre medicate" and "stay ahead" of pain.  Taking narcotic pain medications when you are not having any pain can lead to unnecessary and potentially dangerous side effects.    9. Use the ice machine as much as possible in the first 5-7 days from surgery, then you can wean its use to as needed. The ice typically needs to be replaced every 6 hours, instead of ice you can actually freeze  water bottles to put in the cooler and then fill water around them to avoid having to purchase ice. You can have spare water bottles freezing to allow you to rotate them once they have melted. Try to have a thin shirt or light cloth or towel under the ice wrap to protect your skin.   FOR ADDITIONAL INFO ON ICE MACHINE AND INSTRUCTIONS GO TO THE WEBSITE AT  [email or web address]

## 2021-11-23 NOTE — Progress Notes (Signed)
AssistedDr. Valma Cava with left, interscalene  block. Side rails up, monitors on throughout procedure. See vital signs in flow sheet. Tolerated Procedure well.

## 2021-11-23 NOTE — Anesthesia Procedure Notes (Addendum)
Anesthesia Regional Block: Interscalene brachial plexus block   Pre-Anesthetic Checklist: , timeout performed,  Correct Patient, Correct Site, Correct Laterality,  Correct Procedure, Correct Position, site marked,  Risks and benefits discussed,  Surgical consent,  Pre-op evaluation,  At surgeon's request and post-op pain management  Laterality: Upper and Left  Prep: Maximum Sterile Barrier Precautions used, chloraprep       Needles:  Injection technique: Single-shot  Needle Type: Echogenic Needle     Needle Length: 5cm  Needle Gauge: 21     Additional Needles:   Procedures:,,,, ultrasound used (permanent image in chart),,    Narrative:  Start time: 11/23/2021 11:14 AM End time: 11/23/2021 11:20 AM Injection made incrementally with aspirations every 5 mL.  Performed by: Personally  Anesthesiologist: Barnet Glasgow, MD  Additional Notes: Block assessed prior to procedure. Patient tolerated procedure well.

## 2021-11-23 NOTE — Anesthesia Procedure Notes (Signed)
Procedure Name: Intubation Date/Time: 11/23/2021 1:12 PM Performed by: Lavina Hamman, CRNA Pre-anesthesia Checklist: Patient identified, Emergency Drugs available, Suction available, Patient being monitored and Timeout performed Patient Re-evaluated:Patient Re-evaluated prior to induction Oxygen Delivery Method: Circle system utilized Preoxygenation: Pre-oxygenation with 100% oxygen Induction Type: IV induction Ventilation: Mask ventilation without difficulty Laryngoscope Size: Mac and 3 Grade View: Grade II Tube type: Oral Tube size: 7.5 mm Number of attempts: 1 Airway Equipment and Method: Stylet Placement Confirmation: ETT inserted through vocal cords under direct vision, positive ETCO2, CO2 detector and breath sounds checked- equal and bilateral Secured at: 22 cm Tube secured with: Tape Dental Injury: Teeth and Oropharynx as per pre-operative assessment  Comments: ATOI

## 2021-11-23 NOTE — Anesthesia Postprocedure Evaluation (Addendum)
Anesthesia Post Note  Patient: Timothy [name]  Procedure(s) Performed: Revision open reduction internal fixation left humeral shaft fracture with allograft bone graft (Left: Arm Upper)     Patient location during evaluation: PACU Anesthesia Type: General Level of consciousness: awake and alert and oriented Pain management: pain level controlled Vital Signs Assessment: post-procedure vital signs reviewed and stable Respiratory status: spontaneous breathing, nonlabored ventilation and respiratory function stable Cardiovascular status: blood pressure returned to baseline and stable Postop Assessment: no apparent nausea or vomiting Anesthetic complications: no   No notable events documented.  Last Vitals:  Vitals:   11/23/21 1601 11/23/21 1615  BP:  125/84  Pulse: 71 70  Resp: 13 14  Temp:    SpO2: 94% 95%    Last Pain:  Vitals:   11/23/21 1615  TempSrc:   PainSc: 3                  Tonantzin Mimnaugh A.

## 2021-11-28 ENCOUNTER — Encounter (HOSPITAL_COMMUNITY): Payer: Self-pay | Admitting: Orthopedic Surgery

## 2021-11-29 ENCOUNTER — Encounter (HOSPITAL_COMMUNITY): Payer: Self-pay

## 2021-11-29 ENCOUNTER — Emergency Department (HOSPITAL_COMMUNITY)
Admission: EM | Admit: 2021-11-29 | Discharge: 2021-11-30 | Disposition: A | Discharge status: Home / Self Care | Payer: BC Managed Care – PPO | Attending: Emergency Medicine | Admitting: Emergency Medicine

## 2021-11-29 ENCOUNTER — Other Ambulatory Visit: Payer: Self-pay

## 2021-11-29 DIAGNOSIS — T8133XA Disruption of traumatic injury wound repair, initial encounter: Secondary | ICD-10-CM | POA: Diagnosis present

## 2021-11-29 DIAGNOSIS — Y829 Unspecified medical devices associated with adverse incidents: Secondary | ICD-10-CM | POA: Insufficient documentation

## 2021-11-29 DIAGNOSIS — T8130XA Disruption of wound, unspecified, initial encounter: Secondary | ICD-10-CM

## 2021-11-29 NOTE — ED Triage Notes (Signed)
Pt had surgery on his left humerus Thursday and is now having drainage on his bandage. Bandage removed, sutures intact but one spot is leaking red/brown fluid.

## 2021-11-30 NOTE — Discharge Instructions (Signed)
Continue dressing changes twice daily.   Monitor arm for any redness, increased swelling, increased pain, or fever. Follow-up with Dr. Onnie Graham on Friday in clinic-- call in the morning to get this scheduled.  If any above symptoms present, you should be evaluated sooner.

## 2021-11-30 NOTE — ED Provider Notes (Signed)
[facility] DEPT Provider Note   CSN: [id number] Arrival date & time: 11/29/21  2327     History  Chief Complaint  Patient presents with   Post-op Problem    Timothy [name] is a 51 y.o. male.  The history is provided by the patient and medical records.   51 year old male presenting to the ED with drainage from left arm incision.  He had revision ORIF of left humerus with Dr. Onnie Graham on 11/23/2021.  He was able to go home same day as there were no acute complications.  States he has been doing well.  Took a shower tonight and upon getting out of the shower he noticed some brown/bloody drainage from left arm incision.  States it seems to be coming from isolated area.  He denies any fever, redness, or increased pain to the arm.  He denies any falls/trauma.  Wife did try to call the on call physician twice but could never get through so brought him in for evaluation.  Home Medications Prior to Admission medications   Medication Sig Start Date End Date Taking? Authorizing Provider  atorvastatin (LIPITOR) 20 MG tablet Take 20 mg by mouth at bedtime.    [provider]  busPIRone (BUSPAR) 10 MG tablet Take 10 mg by mouth daily. 08/16/20   [provider]  cyclobenzaprine (FLEXERIL) 10 MG tablet Take 1 tablet (10 mg total) by mouth 3 (three) times daily as needed for muscle spasms. 11/23/21   Shuford, Olivia Mackie, PA-C  docusate sodium (COLACE) 100 MG capsule Take 1 capsule (100 mg total) by mouth 2 (two) times daily. Patient not taking: Reported on 11/20/2021 10/24/20   Debbrah Alar, PA-C  escitalopram (LEXAPRO) 20 MG tablet Take 20 mg by mouth in the morning. 07/28/20   [provider]  fluticasone (FLONASE) 50 MCG/ACT nasal spray Place 1 spray into both nostrils at bedtime.    [provider]  HYDROcodone-acetaminophen (NORCO) 10-325 MG tablet Take 1 tablet by mouth every 6 (six) hours as needed. 11/23/21   Shuford, Olivia Mackie, PA-C   Multiple Vitamins-Minerals (MULTIVITAMIN WITH MINERALS) tablet Take 1 tablet by mouth daily.    [provider]  naproxen (NAPROSYN) 500 MG tablet Take 500 mg by mouth 2 (two) times daily with a meal.    [provider]  ondansetron (ZOFRAN) 4 MG tablet Take 1 tablet (4 mg total) by mouth every 8 (eight) hours as needed for nausea or vomiting. 11/23/21   Shuford, Olivia Mackie, PA-C  pregabalin (LYRICA) 25 MG capsule Take 25-50 mg by mouth See admin instructions. Take 25 mg in the evening and 50 mg at bedtime    [provider]      Allergies    Percocet [oxycodone-acetaminophen], Tetanus toxoids, and Shellfish-derived products    Review of Systems   Review of Systems  Physical Exam Updated Vital Signs BP 112/81 (BP Location: Right Arm)   Pulse 89   Temp 98 F (36.7 C) (Oral)   Resp 16   SpO2 95%   Physical Exam Vitals and nursing note reviewed.  Constitutional:      Appearance: He is well-developed.  HENT:     Head: Normocephalic and atraumatic.  Eyes:     Conjunctiva/sclera: Conjunctivae normal.     Pupils: Pupils are equal, round, and reactive to light.  Cardiovascular:     Rate and Rhythm: Normal rate and regular rhythm.     Heart sounds: Normal heart sounds.  Pulmonary:     Effort:  Pulmonary effort is normal.     Breath sounds: Normal breath sounds.  Abdominal:     General: Bowel sounds are normal.     Palpations: Abdomen is soft.  Musculoskeletal:        General: Normal range of motion.     Cervical back: Normal range of motion.     Comments: Incision along left humerus, closed with subcuticular sutures with overlying Dermabond, there is small opening along distal portion of incision where there is brown/bloody drainage present, when pressure applied further drainage can be expressed, remainder of incision is strongly adhered, there is no overlying erythema of the arm, no warmth to touch, no tissue crepitus, arm is neurovascularly intact  Skin:     General: Skin is warm and dry.  Neurological:     Mental Status: He is alert and oriented to person, place, and time.      ED Results / Procedures / Treatments   Labs (all labs ordered are listed, but only abnormal results are displayed) Labs Reviewed - No data to display  EKG None  Radiology No results found.  Procedures Procedures    Medications Ordered in ED Medications - No data to display  ED Course/ Medical Decision Making/ A&P                           Medical Decision Making  51 year old male here with postop problem.  Had revision ORIF of left humerus with Dr. Onnie Graham on 11/23/2021.  He has been doing well but tonight after getting out of shower he had small amount of drainage from wound.  On exam he is afebrile, nontoxic.  Incision appears clean, does have punctate area along distal incision that is draining small amounts of brown/bloody drainage.  Remainder of arm without erythema, induration, or warmth to touch.  There is no tissue crepitus.  His arm is neurovascularly intact.  See photos above.  Discussed with on-call Orthopedics, Dr. Leisa Lenz surgery was 6 days ago, no emergent closure.  Recommended dry dressings and follow-up with Dr. Onnie Graham on Friday in clinic.  Without fever or other signs of infection, would hold off on antibiotics at this time.  New dressing applied for patient, discussed home wound care instructions and monitoring for any acute signs of infection.  He was given extra dressing supplies.  They will call office in the morning to arrange follow-up on Friday.  Can return here for any new or acute changes.  Final Clinical Impression(s) / ED Diagnoses Final diagnoses:  Wound dehiscence    Rx / DC Orders ED Discharge Orders     None         Larene Pickett, PA-C 11/30/21 0038    Quintella Reichert, MD 11/30/21 [phone]

## 2022-02-14 ENCOUNTER — Other Ambulatory Visit: Payer: Self-pay | Admitting: Student

## 2022-02-14 DIAGNOSIS — S42325K Nondisplaced transverse fracture of shaft of humerus, left arm, subsequent encounter for fracture with nonunion: Secondary | ICD-10-CM

## 2022-02-21 ENCOUNTER — Ambulatory Visit
Admission: RE | Admit: 2022-02-21 | Discharge: 2022-02-21 | Disposition: A | Discharge status: Home / Self Care | Payer: BC Managed Care – PPO | Source: Ambulatory Visit | Attending: Student | Admitting: Student

## 2022-02-21 DIAGNOSIS — S42325K Nondisplaced transverse fracture of shaft of humerus, left arm, subsequent encounter for fracture with nonunion: Secondary | ICD-10-CM

## 2022-02-27 IMAGING — CT CT CERVICAL SPINE W/O CM
3 of 4 series · 10 of 33 positions shown, 12 images · non-contrast
Comparison: None.

CLINICAL DATA: Neck trauma, fell down stairs.

EXAM:
CT HEAD WITHOUT CONTRAST
CT CERVICAL SPINE WITHOUT CONTRAST
TECHNIQUE: Multidetector CT imaging of the head and cervical spine was
performed following the standard protocol without intravenous
contrast. Multiplanar CT image reconstructions of the cervical spine
were also generated.

[Series 4: orthogonal axials · axial · 0.21mm/px · z∈[-444,-385]mm · 2 of 88 slices shown, 3 images]
[im 30/88  soft-tissue]
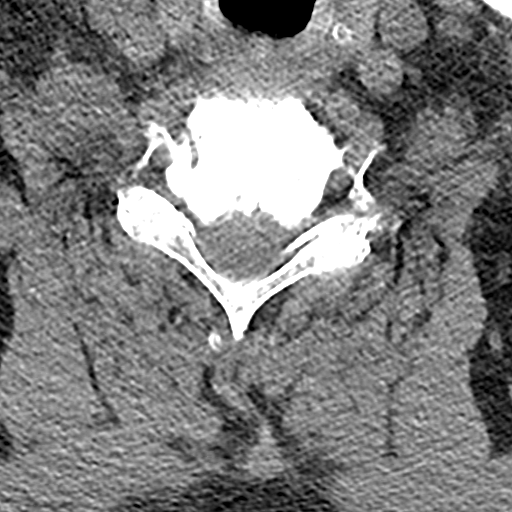
[im 30/88  bone]
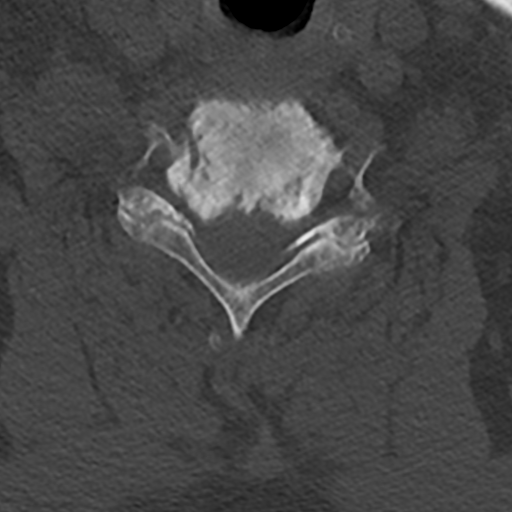
[im 59/88  bone]
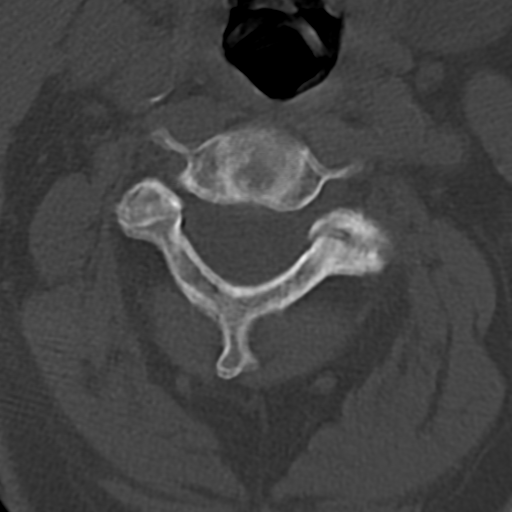

[Series 6: sag bone · sagittal · 0.35mm/px · 5 of 230 slices shown, 6 images]
[im 77/230  bone]
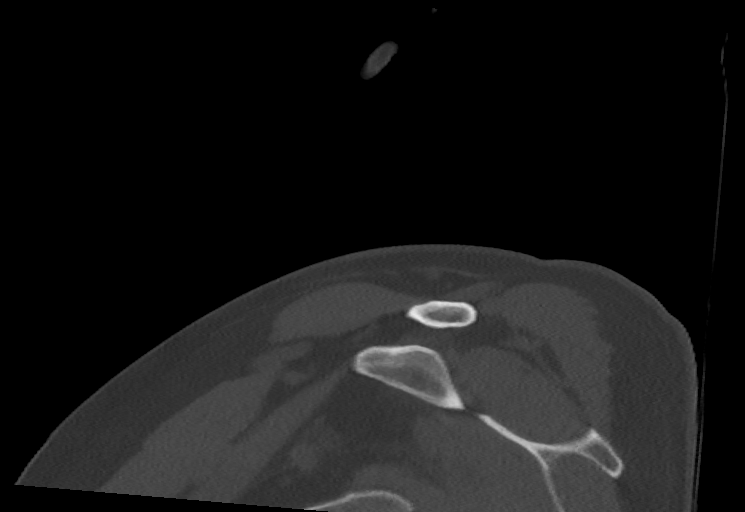
[im 96/230  bone]
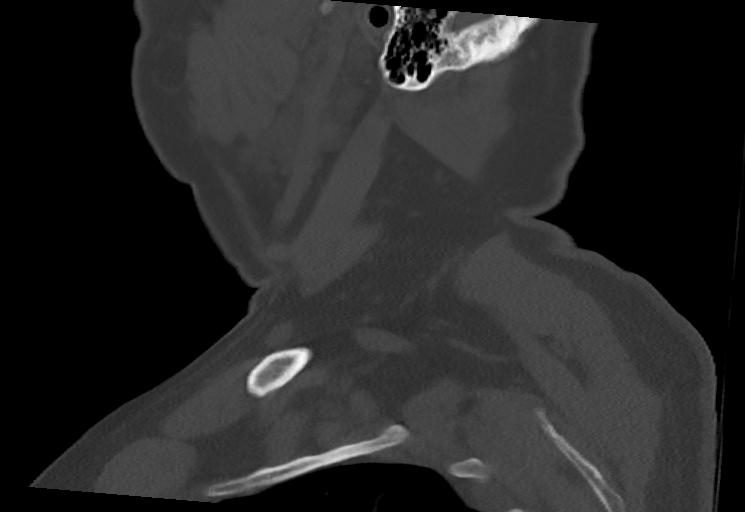
[im 115/230  soft-tissue]
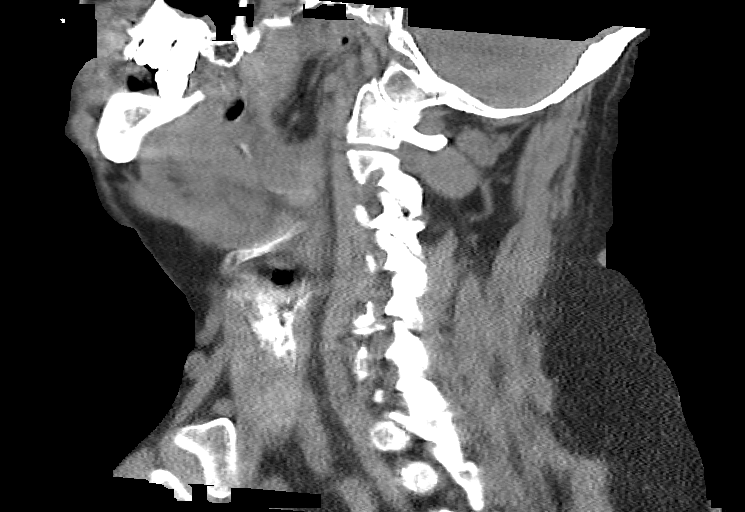
[im 115/230  bone]
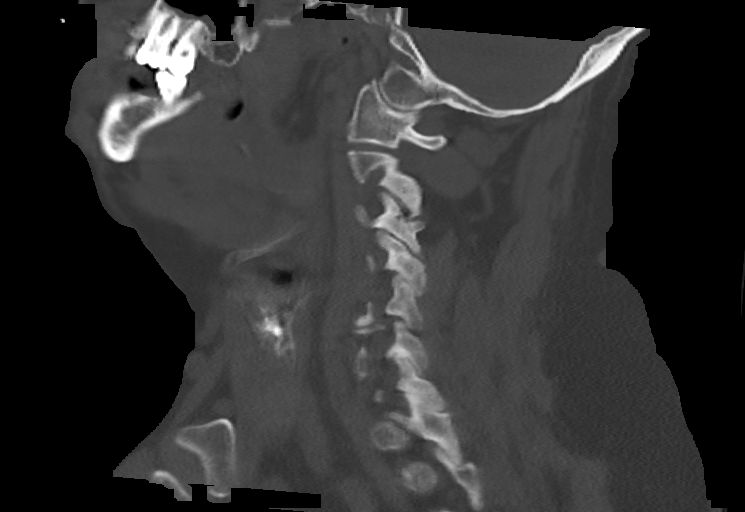
[im 134/230  bone]
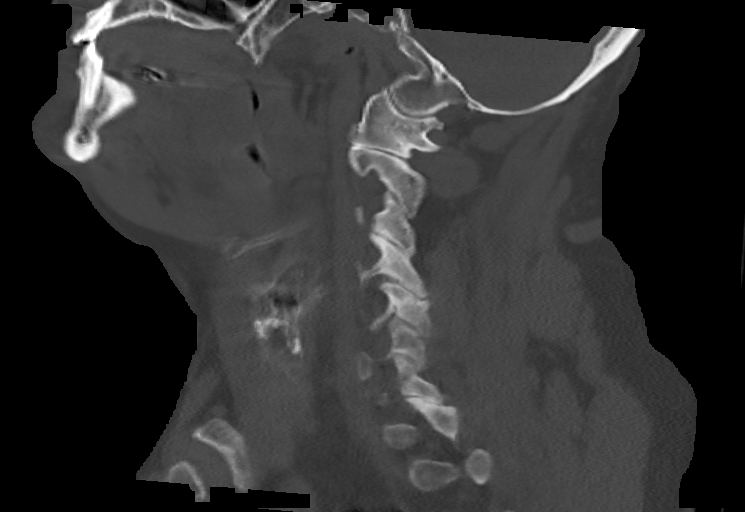
[im 153/230  bone]
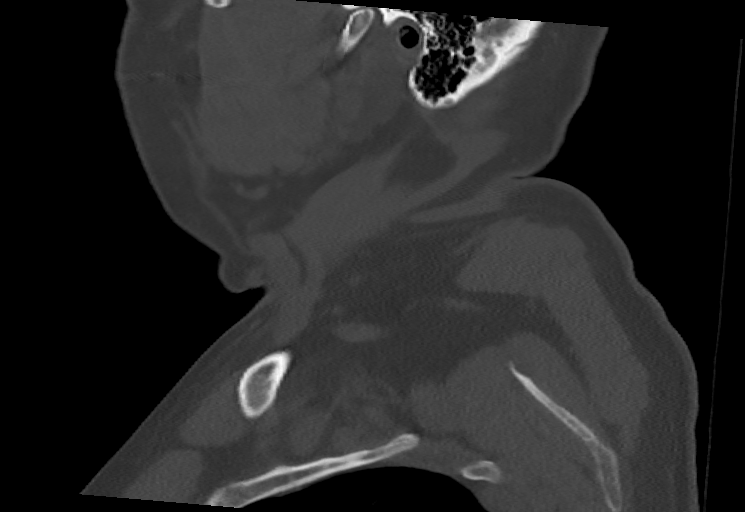

[Series 10: cor bone · coronal · 0.35mm/px · 3 of 132 slices shown]
[im 27/132  bone]
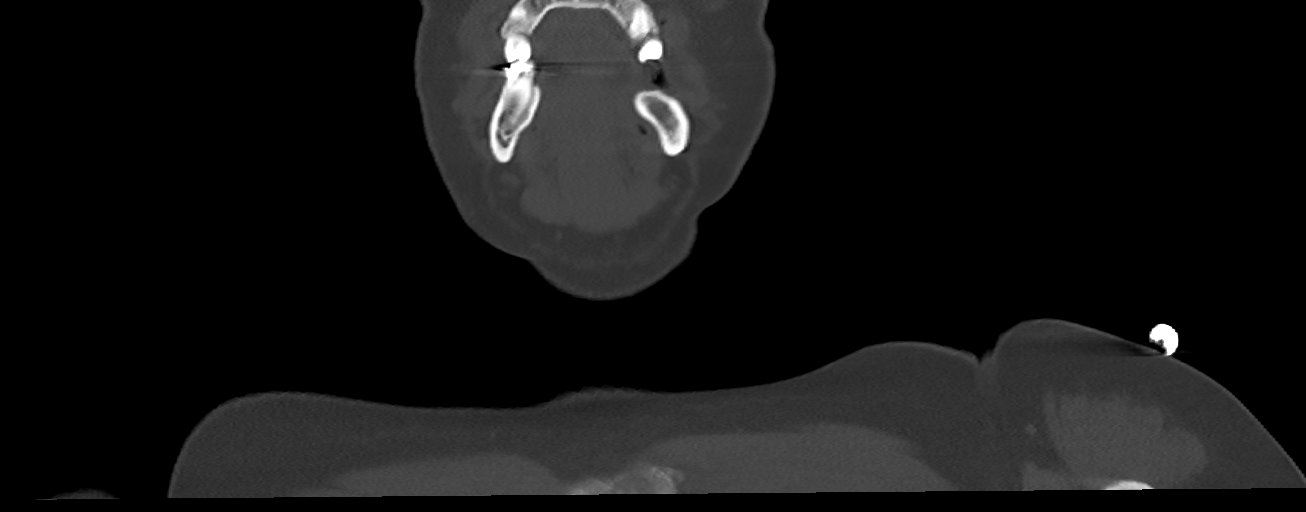
[im 53/132  bone]
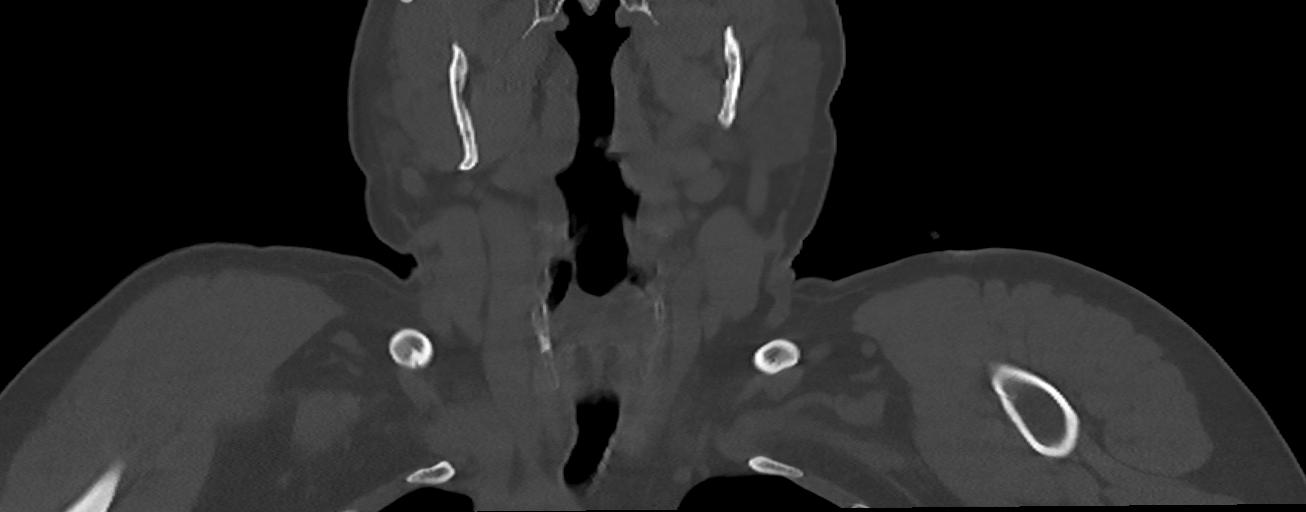
[im 79/132  bone]
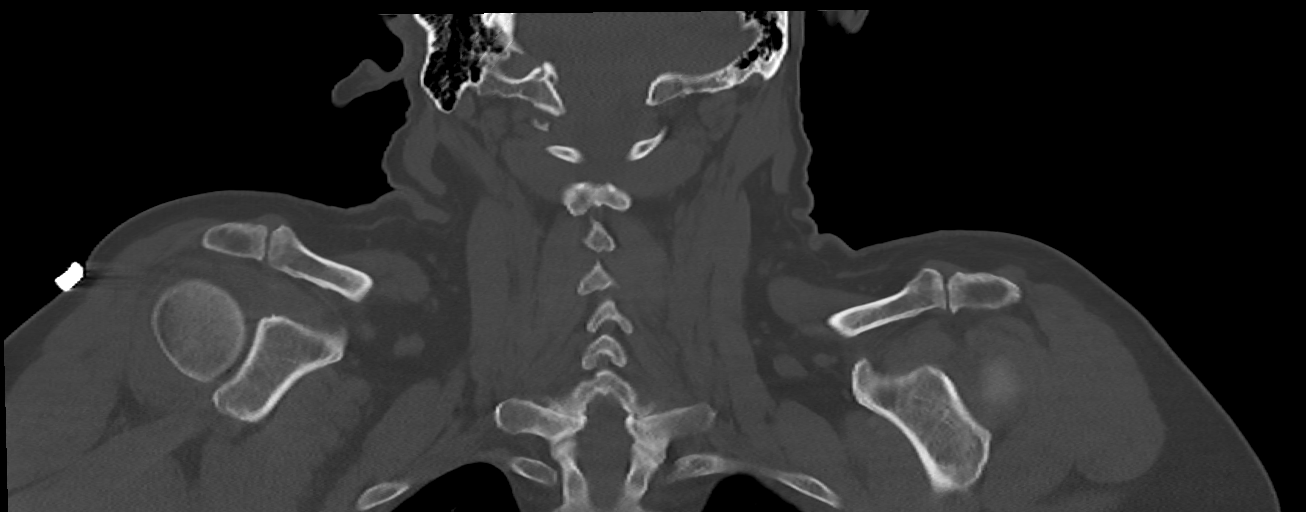

[10 of 33 positions shown; findings below may reference images not displayed]

FINDINGS: CT HEAD FINDINGS

Brain: No acute intracranial hemorrhage, midline shift or mass
effect. No extra-axial fluid collection. Gray-white matter
differentiation is within normal limits and there is no
hydrocephalus.

Vascular: No hyperdense vessel or unexpected calcification.

Skull: Normal. Negative for fracture or focal lesion.

Sinuses/Orbits: Mild mucosal thickening is present in the maxillary
sinuses bilaterally. The orbits are unremarkable.

Other: None.

CT CERVICAL SPINE FINDINGS

Alignment: There is mild anterolisthesis at C7-T1.

Skull base and vertebrae: No acute fracture. No primary bone lesion
or focal pathologic process.

Soft tissues and spinal canal: No prevertebral fluid or swelling. No
visible canal hematoma.

Disc levels: Intervertebral disc space narrowing, osteophyte
formation, sclerosis, and facet arthropathy are present at multiple
levels resulting in mild-to-moderate spinal canal and neural
foraminal stenosis, most pronounced at C6-C7.

Upper chest: Negative.

Other: None.
IMPRESSION: 1. No acute intracranial hemorrhage.
2. Multilevel degenerative changes in the cervical spine without
evidence of acute fracture.

## 2022-02-27 IMAGING — DX DG FOREARM 2V*L*
2 series · 2 of 2 positions shown · non-contrast
Comparison: None.

CLINICAL DATA: Fall with left humerus fracture

EXAM:
LEFT FOREARM - 2 VIEW

[forearm ap]
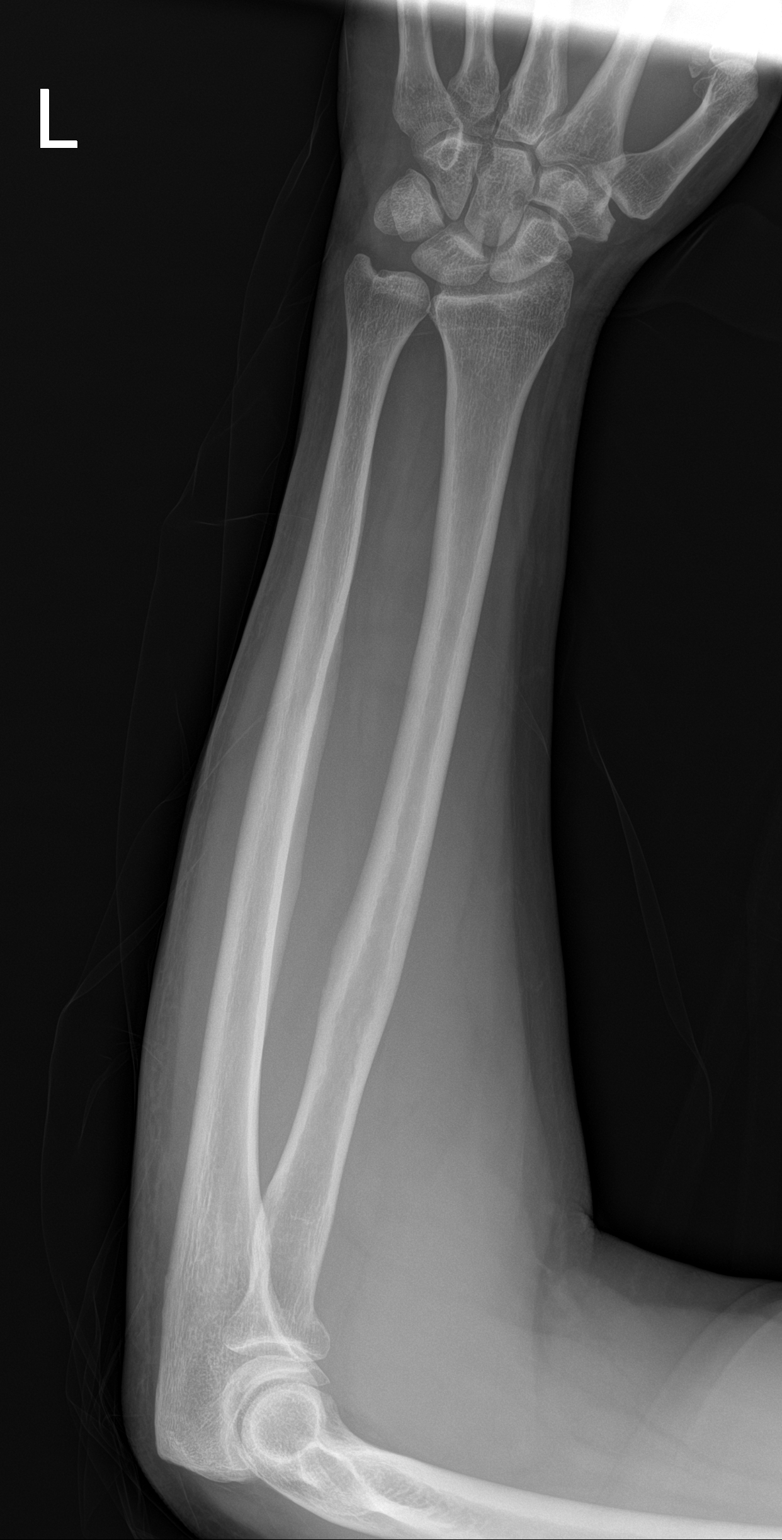

[forearm lat]
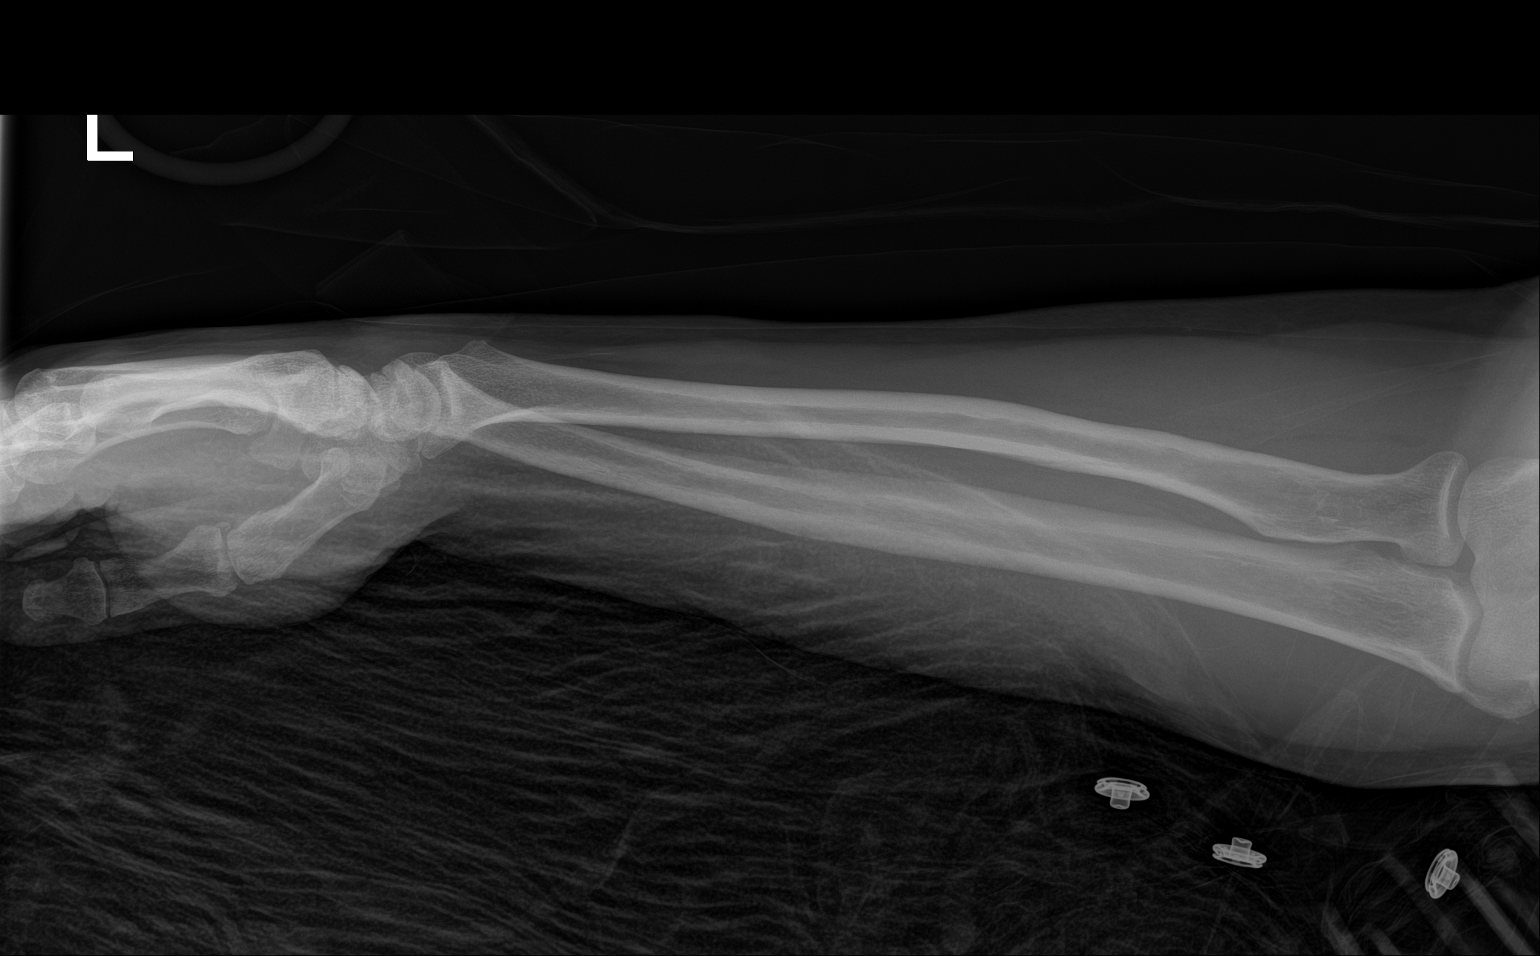

[2 of 2 positions shown; findings below may reference images not displayed]

FINDINGS: There is no evidence of fracture or other focal bone lesions. Soft
tissues are unremarkable.
IMPRESSION: Negative.

## 2022-02-27 IMAGING — CT CT HEAD W/O CM
4 series · 17 of 47 positions shown, 19 images · non-contrast
Comparison: None.

CLINICAL DATA: Neck trauma, fell down stairs.

EXAM:
CT HEAD WITHOUT CONTRAST
CT CERVICAL SPINE WITHOUT CONTRAST
TECHNIQUE: Multidetector CT imaging of the head and cervical spine was
performed following the standard protocol without intravenous
contrast. Multiplanar CT image reconstructions of the cervical spine
were also generated.

[Series 3: head wo · axial · 0.41mm/px · z∈[-343,-203]mm · 7 of 38 slices shown, 9 images]
[im 5/38  brain]
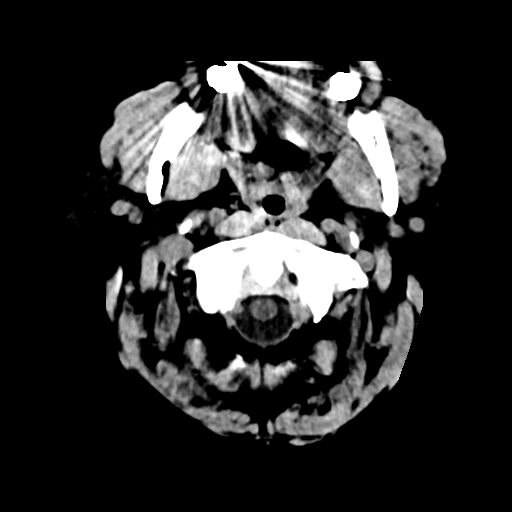
[im 5/38  bone]
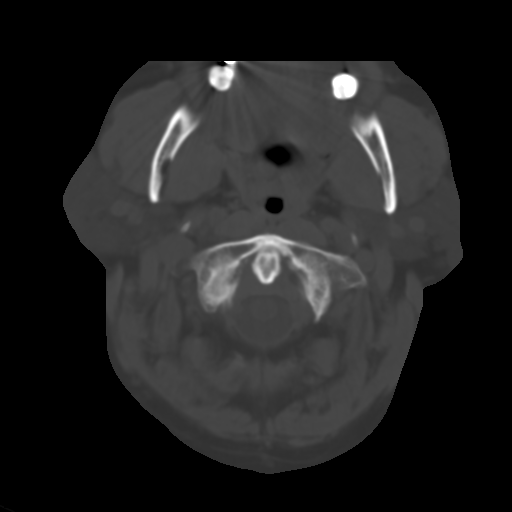
[im 10/38  brain]
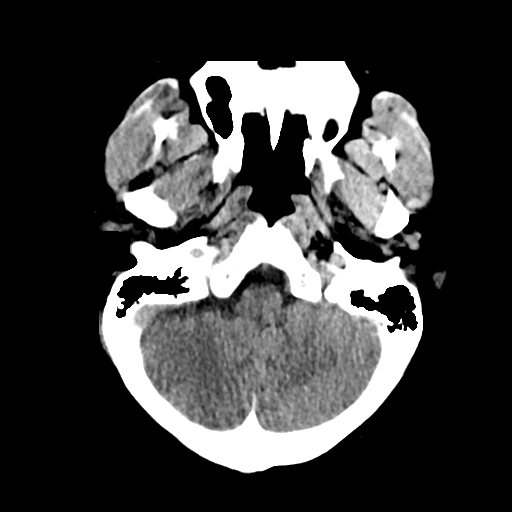
[im 14/38  brain]
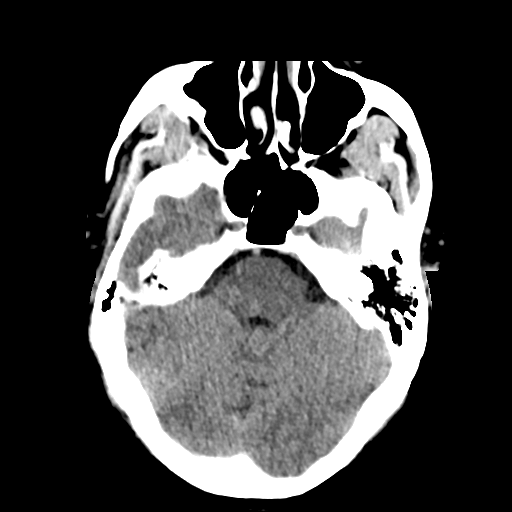
[im 19/38  brain]
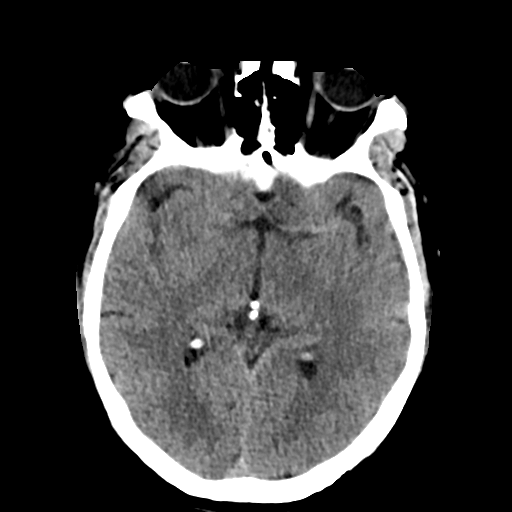
[im 24/38  brain]
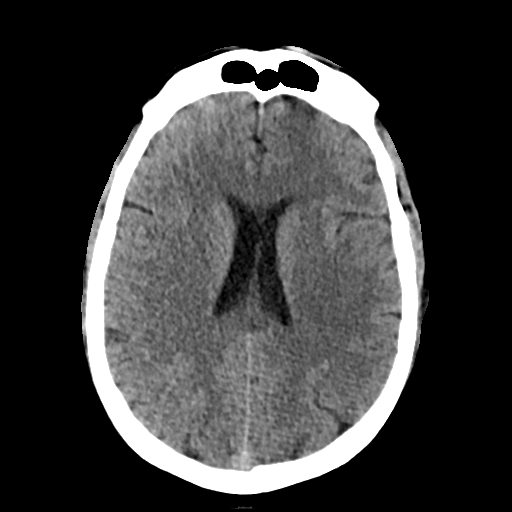
[im 24/38  bone]
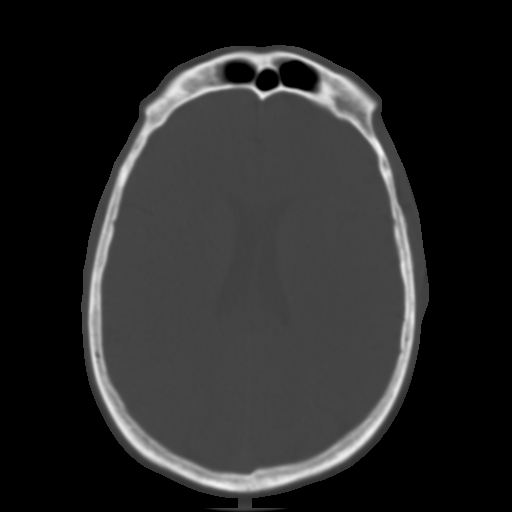
[im 28/38  brain]
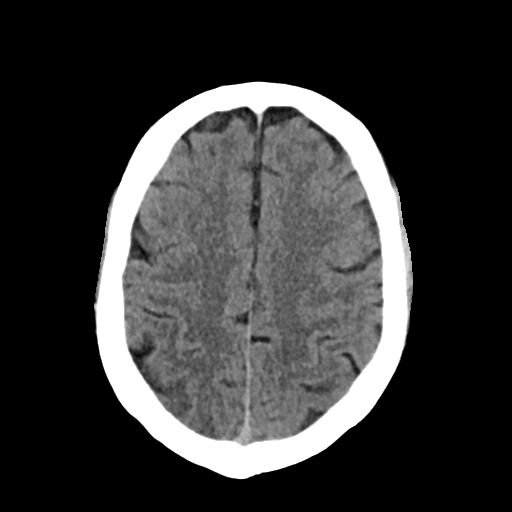
[im 33/38  brain]
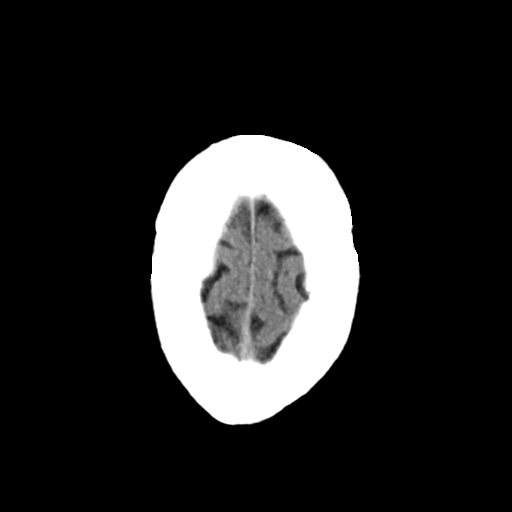

[Series 4: head bone · axial · 0.41mm/px · z∈[-345,-281]mm · 4 of 94 slices shown]
[im 10/94  bone]
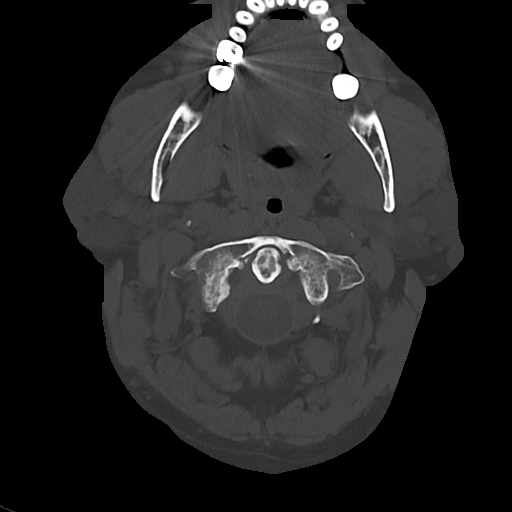
[im 19/94  bone]
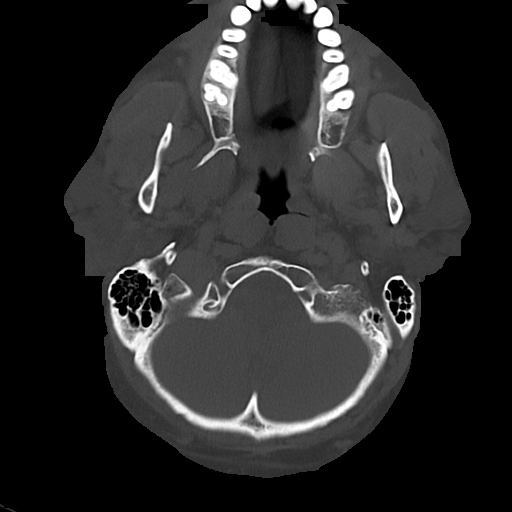
[im 28/94  bone]
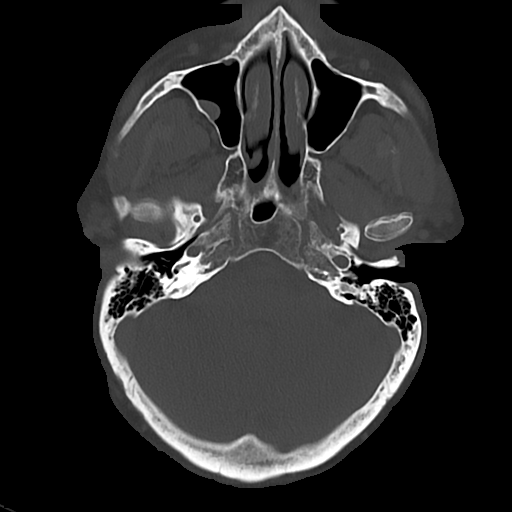
[im 42/94  bone]
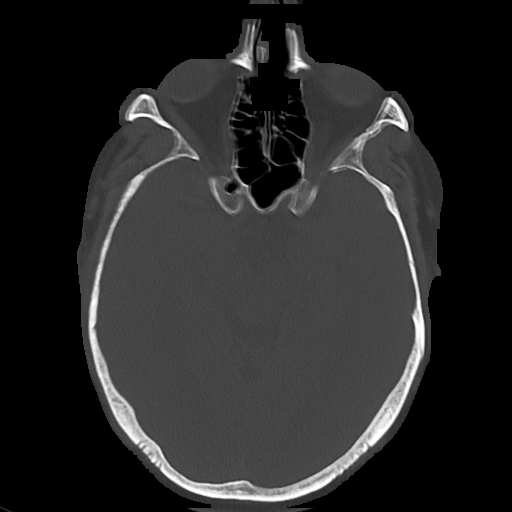

[Series 5: cor soft · coronal · 0.38mm/px · 3 of 77 slices shown]
[im 29/77  brain]
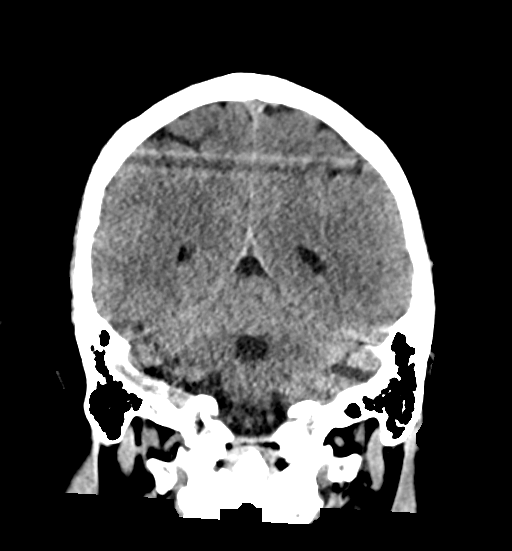
[im 35/77  brain]
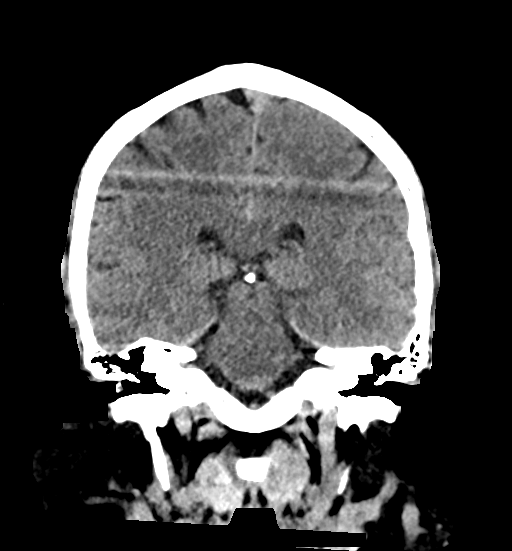
[im 42/77  brain]
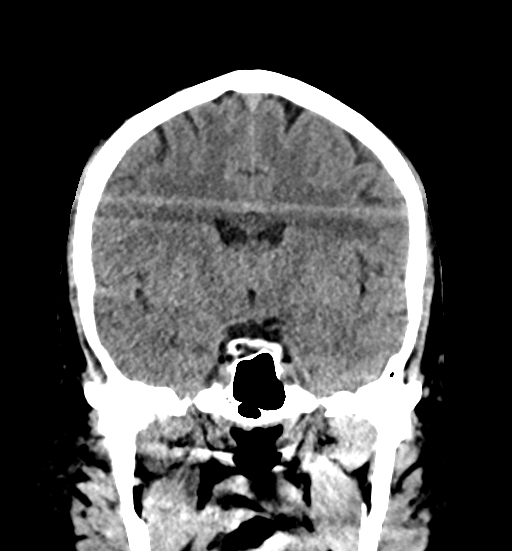

[Series 6: sag soft · sagittal · 0.41mm/px · 3 of 65 slices shown]
[im 22/65  brain]
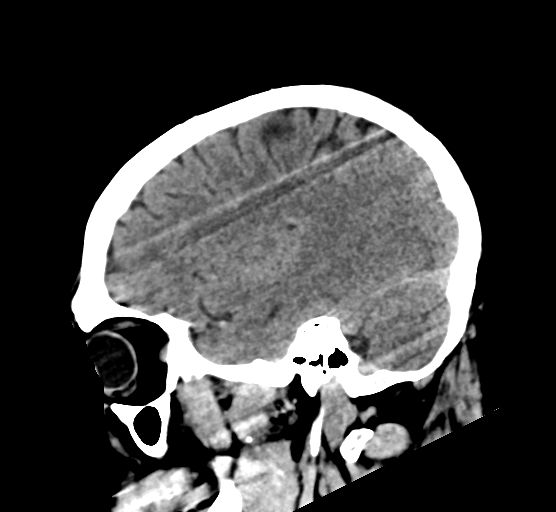
[im 33/65  brain]
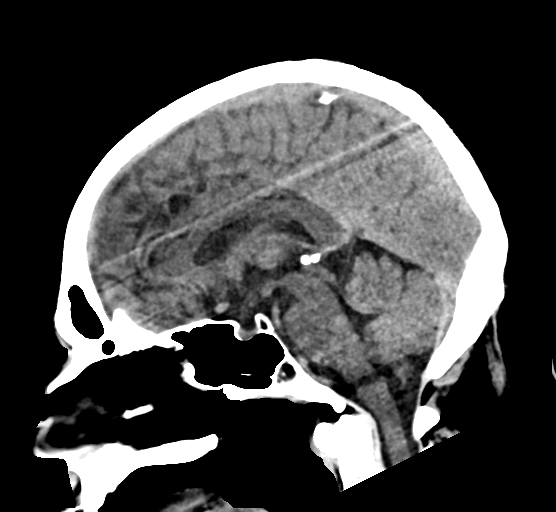
[im 43/65  brain]
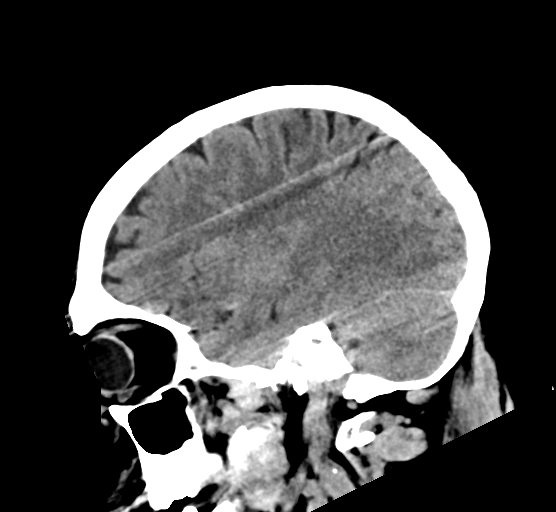

[17 of 47 positions shown; findings below may reference images not displayed]

FINDINGS: CT HEAD FINDINGS

Brain: No acute intracranial hemorrhage, midline shift or mass
effect. No extra-axial fluid collection. Gray-white matter
differentiation is within normal limits and there is no
hydrocephalus.

Vascular: No hyperdense vessel or unexpected calcification.

Skull: Normal. Negative for fracture or focal lesion.

Sinuses/Orbits: Mild mucosal thickening is present in the maxillary
sinuses bilaterally. The orbits are unremarkable.

Other: None.

CT CERVICAL SPINE FINDINGS

Alignment: There is mild anterolisthesis at C7-T1.

Skull base and vertebrae: No acute fracture. No primary bone lesion
or focal pathologic process.

Soft tissues and spinal canal: No prevertebral fluid or swelling. No
visible canal hematoma.

Disc levels: Intervertebral disc space narrowing, osteophyte
formation, sclerosis, and facet arthropathy are present at multiple
levels resulting in mild-to-moderate spinal canal and neural
foraminal stenosis, most pronounced at C6-C7.

Upper chest: Negative.

Other: None.
IMPRESSION: 1. No acute intracranial hemorrhage.
2. Multilevel degenerative changes in the cervical spine without
evidence of acute fracture.

## 2022-03-02 ENCOUNTER — Ambulatory Visit: Payer: Self-pay | Admitting: Student

## 2022-03-07 ENCOUNTER — Other Ambulatory Visit: Payer: BC Managed Care – PPO

## 2022-03-08 ENCOUNTER — Encounter (HOSPITAL_COMMUNITY): Payer: Self-pay | Admitting: Student

## 2022-03-08 ENCOUNTER — Other Ambulatory Visit: Payer: Self-pay

## 2022-03-08 NOTE — Progress Notes (Signed)
Anesthesia Review:  PCP: Cardiologist : none  Chest x-ray :06/13/21- 1 view  EKG : 2017  Echo : Stress test: Cardiac Cath :  Activity level: can do a flight of stairs without difficulty  Sleep Study/ CPAP : none  Fasting Blood Sugar :      / Checks Blood Sugar -- times a day:   Blood Thinner/ Instructions /Last Dose: ASA / Instructions/ Last Dose :

## 2022-03-13 NOTE — H&P (Signed)
[facility] H&P  Patient ID: Timothy [name] MRN: [id number] DOB/AGE: [birth year] 51 y.o.  Reason for Surgery: Left humeral nonunion  HPI: Timothy [name] is an 51 y.o. male  who sustained an initial transverse midshaft left humerus fracture back in early December 2022.  Initial conservative management was attempted but he continued to have significant pain as well as instability at the fracture site with no radiographic evidence of healing after 2 months.  He was subsequently taken to surgery in late February 2023 by Dr. Onnie Graham for ORIF with allograft bone grafting. Did well following this procedure until he sustained a fall in May 2023, landing on the left arm. Was noted to have gross instability of the humerus following this fall. Was taken back to the OR by Dr. Onnie Graham for revision ORIF of the humerus. Fracture has gone on to nonunion with concern for hardware failure. Has been referred to OTS for evaluation. Presents now for revision surgery of the left humerus.    Past Medical History:  Diagnosis Date   Anxiety    GERD (gastroesophageal reflux disease)    Hepatitis    age 72.    Panic attack    Prostate cancer [facility])     Past Surgical History:  Procedure Laterality Date   FRACTURE SURGERY Right 2012   rods, screws   LEG SURGERY     LYMPHADENECTOMY Bilateral 10/24/2020   Procedure: LYMPHADENECTOMY, PELVIC;  Surgeon: Janith Lima, MD;  Location: [facility] ORS;  Service: Urology;  Laterality: Bilateral;   ORIF HUMERUS FRACTURE Left 09/2021   ORIF HUMERUS FRACTURE Left 11/23/2021   Procedure: Revision open reduction internal fixation left humeral shaft fracture with allograft bone graft;  Surgeon: Justice Britain, MD;  Location: [facility] ORS;  Service: Orthopedics;  Laterality: Left;  127mn   PROSTATE BIOPSY     ROBOT ASSISTED LAPAROSCOPIC RADICAL PROSTATECTOMY N/A 10/24/2020   Procedure: XI ROBOTIC ASSISTED LAPAROSCOPIC RADICAL PROSTATECTOMY;  Surgeon: GJanith Lima MD;  Location: [facility] ORS;  Service: Urology;  Laterality: N/A;  NEEDS 240 MIN   WISDOM TOOTH EXTRACTION      Family History  Adopted: Yes  Problem Relation Age of Onset   Breast cancer Neg Hx    Colon cancer Neg Hx    Pancreatic cancer Neg Hx    Prostate cancer Neg Hx     Social History:  reports that he has quit smoking. His smoking use included cigarettes. He has a 31.50 pack-year smoking history. He has never used smokeless tobacco. He reports current alcohol use. He reports that he does not use drugs.  Allergies:  Allergies  Allergen Reactions   Nsaids Other (See Comments)    non union    Percocet [Oxycodone-Acetaminophen] Other (See Comments)    Pt states he stays awake for a very long period of time. No problems with the 5/325 only can take 1 a day   Tetanus Toxoids Swelling   Shellfish-Derived Products Rash    Clams and Oysters    Medications: I have reviewed the patient's current medications. Prior to Admission:  No medications prior to admission.    ROS: Constitutional: No fever or chills Vision: No changes in vision ENT: No difficulty swallowing CV: No chest pain Pulm: No SOB or wheezing GI: No nausea or vomiting GU: No urgency or inability to hold urine Skin: No poor wound healing Neurologic: No numbness or tingling Psychiatric: No depression or anxiety Heme: No bruising Allergic: No reaction to medications or food  Exam: Height 5' 6.5" (1.689 m), weight 97.1 kg. General:NAD Orientation:Alert and oriented x3  Mood and Affect: Mood and affect  Gait: WNL Coordination and balance: WNL  LUE: Healed surgical incision. Mildly tender through humerus. Shoulder motion within normal limits. Motor and sensory function intact. Neurovascularly intact  RUE: Skin without lesions. No tenderness to palpation. Full painless ROM, full strength in each muscle group without evidence of instability. Motor and sensory function intact. Neurovascularly  intact.   Medical Decision Making: Data: Imaging: CT scan left humerus shows plates and screws in position. Persistent fracture line without significant bony bridging. Hardware is intact without evidence of loosening. There is periosteal reaction along the fracture.    Labs: No results found for this or any previous visit (from the past 168 hour(s)).  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 Would recommend proceeding with removal of current hardware and intramedullary nailing of the left humerus. We will plan to obtain intra-operative cultures to evaluate for infection as cause of nonunion. Will plan to admit patient for pain control, observation, and appropriate antibiotics as needed. Risks and benefits of te procedure have been discussed with the patient. He agrees to proceed. Consent will be obtained.   Gwinda Passe PA-C [facility] [phone] (office) [email or web address]

## 2022-03-14 ENCOUNTER — Other Ambulatory Visit: Payer: Self-pay

## 2022-03-14 ENCOUNTER — Inpatient Hospital Stay (HOSPITAL_COMMUNITY): Payer: BC Managed Care – PPO

## 2022-03-14 ENCOUNTER — Observation Stay (HOSPITAL_COMMUNITY): Payer: BC Managed Care – PPO

## 2022-03-14 ENCOUNTER — Encounter (HOSPITAL_COMMUNITY): Payer: Self-pay | Admitting: Student

## 2022-03-14 ENCOUNTER — Inpatient Hospital Stay (HOSPITAL_COMMUNITY): Payer: BC Managed Care – PPO | Admitting: Anesthesiology

## 2022-03-14 ENCOUNTER — Encounter (HOSPITAL_COMMUNITY)
Admission: RE | Disposition: A | Discharge status: Home / Self Care | Payer: Self-pay | Source: Home / Self Care | Attending: Student

## 2022-03-14 ENCOUNTER — Observation Stay (HOSPITAL_COMMUNITY)
Admission: RE | Admit: 2022-03-14 | Discharge: 2022-03-16 | Disposition: A | Discharge status: Home / Self Care | Payer: BC Managed Care – PPO | Attending: Student | Admitting: Student

## 2022-03-14 DIAGNOSIS — Z87891 Personal history of nicotine dependence: Secondary | ICD-10-CM | POA: Diagnosis not present

## 2022-03-14 DIAGNOSIS — W19XXXA Unspecified fall, initial encounter: Secondary | ICD-10-CM | POA: Insufficient documentation

## 2022-03-14 DIAGNOSIS — S42352K Displaced comminuted fracture of shaft of humerus, left arm, subsequent encounter for fracture with nonunion: Principal | ICD-10-CM | POA: Diagnosis present

## 2022-03-14 DIAGNOSIS — Z8546 Personal history of malignant neoplasm of prostate: Secondary | ICD-10-CM | POA: Diagnosis not present

## 2022-03-14 DIAGNOSIS — R7309 Other abnormal glucose: Secondary | ICD-10-CM | POA: Insufficient documentation

## 2022-03-14 DIAGNOSIS — S42302K Unspecified fracture of shaft of humerus, left arm, subsequent encounter for fracture with nonunion: Principal | ICD-10-CM | POA: Insufficient documentation

## 2022-03-14 HISTORY — PX: HUMERUS IM NAIL: SHX1769

## 2022-03-14 LAB — COMPREHENSIVE METABOLIC PANEL
ALT: 24 U/L (ref 0–44)
AST: 26 U/L (ref 15–41)
Albumin: 3.4 g/dL — ABNORMAL LOW (ref 3.5–5.0)
Alkaline Phosphatase: 93 U/L (ref 38–126)
Anion gap: 9 (ref 5–15)
BUN: 15 mg/dL (ref 6–20)
CO2: 23 mmol/L (ref 22–32)
Calcium: 8.9 mg/dL (ref 8.9–10.3)
Chloride: 108 mmol/L (ref 98–111)
Creatinine, Ser: 0.63 mg/dL (ref 0.61–1.24)
GFR, Estimated: >60 mL/min (ref >60)
Glucose, Bld: 98 mg/dL (ref 70–99)
Potassium: 4.1 mmol/L (ref 3.5–5.1)
Sodium: 140 mmol/L (ref 135–145)
Total Bilirubin: 0.7 mg/dL (ref 0.3–1.2)
Total Protein: 6.6 g/dL (ref 6.5–8.1)

## 2022-03-14 LAB — CBC
HCT: 44.1 % (ref 39.0–52.0)
Hemoglobin: 14.9 g/dL (ref 13.0–17.0)
MCH: 31 pg (ref 26.0–34.0)
MCHC: 33.8 g/dL (ref 30.0–36.0)
MCV: 91.7 fL (ref 80.0–100.0)
Platelets: 268 10*3/uL (ref 150–400)
RBC: 4.81 MIL/uL (ref 4.22–5.81)
RDW: 12.7 % (ref 11.5–15.5)
WBC: 7.4 10*3/uL (ref 4.0–10.5)
nRBC: 0 % (ref 0.0–0.2)

## 2022-03-14 LAB — GLUCOSE, CAPILLARY: Glucose-Capillary: 124 mg/dL — ABNORMAL HIGH (ref 70–99)

## 2022-03-14 SURGERY — INSERTION, INTRAMEDULLARY ROD, HUMERUS
Anesthesia: General | Laterality: Left

## 2022-03-14 MED ORDER — METOCLOPRAMIDE HCL 5 MG PO TABS: 5.0000 mg | ORAL_TABLET | Freq: Three times a day (TID) | ORAL | Status: DC | PRN

## 2022-03-14 MED ORDER — VANCOMYCIN HCL 1000 MG IV SOLR
INTRAVENOUS | Status: AC
  Filled 2022-03-14: qty 20

## 2022-03-14 MED ORDER — EPHEDRINE SULFATE-NACL 50-0.9 MG/10ML-% IV SOSY
PREFILLED_SYRINGE | INTRAVENOUS | Status: DC | PRN
  Administered 2022-03-14: 5 mg via INTRAVENOUS
  Administered 2022-03-14: 2.5 mg via INTRAVENOUS

## 2022-03-14 MED ORDER — ACETAMINOPHEN 10 MG/ML IV SOLN
INTRAVENOUS | Status: AC
Start: 2022-03-14 — End: ?
  Filled 2022-03-14: qty 100

## 2022-03-14 MED ORDER — HYDROMORPHONE HCL 1 MG/ML IJ SOLN
INTRAMUSCULAR | Status: AC
  Filled 2022-03-14: qty 1

## 2022-03-14 MED ORDER — ONDANSETRON HCL 4 MG/2ML IJ SOLN: 4.0000 mg | Freq: Four times a day (QID) | INTRAMUSCULAR | Status: DC | PRN

## 2022-03-14 MED ORDER — ATORVASTATIN CALCIUM 10 MG PO TABS
20.0000 mg | ORAL_TABLET | Freq: Every day | ORAL | Status: DC
  Administered 2022-03-14 – 2022-03-15 (×2): 20 mg via ORAL
  Filled 2022-03-14 (×2): qty 2

## 2022-03-14 MED ORDER — LIDOCAINE 2% (20 MG/ML) 5 ML SYRINGE
INTRAMUSCULAR | Status: DC | PRN
  Administered 2022-03-14: 100 mg via INTRAVENOUS

## 2022-03-14 MED ORDER — MIDAZOLAM HCL 2 MG/2ML IJ SOLN
INTRAMUSCULAR | Status: AC
  Filled 2022-03-14: qty 2

## 2022-03-14 MED ORDER — STERILE WATER FOR IRRIGATION IR SOLN
Status: DC | PRN
  Administered 2022-03-14: 1000 mL

## 2022-03-14 MED ORDER — SODIUM CHLORIDE 0.9 % IV SOLN
2.0000 g | INTRAVENOUS | Status: DC
  Administered 2022-03-14 – 2022-03-15 (×2): 2 g via INTRAVENOUS
  Filled 2022-03-14 (×2): qty 20

## 2022-03-14 MED ORDER — DOCUSATE SODIUM 100 MG PO CAPS
100.0000 mg | ORAL_CAPSULE | Freq: Two times a day (BID) | ORAL | Status: DC
  Administered 2022-03-14 – 2022-03-15 (×3): 100 mg via ORAL
  Filled 2022-03-14 (×4): qty 1

## 2022-03-14 MED ORDER — ROCURONIUM BROMIDE 10 MG/ML (PF) SYRINGE
PREFILLED_SYRINGE | INTRAVENOUS | Status: AC
  Filled 2022-03-14: qty 10

## 2022-03-14 MED ORDER — METOCLOPRAMIDE HCL 5 MG/ML IJ SOLN: 5.0000 mg | Freq: Three times a day (TID) | INTRAMUSCULAR | Status: DC | PRN

## 2022-03-14 MED ORDER — CEFAZOLIN SODIUM-DEXTROSE 2-3 GM-%(50ML) IV SOLR
INTRAVENOUS | Status: DC | PRN
  Administered 2022-03-14: 2 g via INTRAVENOUS

## 2022-03-14 MED ORDER — ONDANSETRON HCL 4 MG/2ML IJ SOLN
INTRAMUSCULAR | Status: DC | PRN
  Administered 2022-03-14: 4 mg via INTRAVENOUS

## 2022-03-14 MED ORDER — FENTANYL CITRATE (PF) 250 MCG/5ML IJ SOLN
INTRAMUSCULAR | Status: DC | PRN
Start: 2022-03-14 — End: 2022-03-14
  Administered 2022-03-14 (×5): 50 ug via INTRAVENOUS

## 2022-03-14 MED ORDER — ONDANSETRON HCL 4 MG PO TABS: 4.0000 mg | ORAL_TABLET | Freq: Four times a day (QID) | ORAL | Status: DC | PRN

## 2022-03-14 MED ORDER — LACTATED RINGERS IV SOLN: INTRAVENOUS | Status: DC

## 2022-03-14 MED ORDER — PHENYLEPHRINE 80 MCG/ML (10ML) SYRINGE FOR IV PUSH (FOR BLOOD PRESSURE SUPPORT)
PREFILLED_SYRINGE | INTRAVENOUS | Status: DC | PRN
  Administered 2022-03-14 (×2): 40 ug via INTRAVENOUS
  Administered 2022-03-14: 80 ug via INTRAVENOUS

## 2022-03-14 MED ORDER — ACETAMINOPHEN 500 MG PO TABS
1000.0000 mg | ORAL_TABLET | Freq: Four times a day (QID) | ORAL | Status: DC
  Administered 2022-03-14 – 2022-03-16 (×7): 1000 mg via ORAL
  Filled 2022-03-14 (×7): qty 2

## 2022-03-14 MED ORDER — ROCURONIUM BROMIDE 10 MG/ML (PF) SYRINGE
PREFILLED_SYRINGE | INTRAVENOUS | Status: DC | PRN
  Administered 2022-03-14: 40 mg via INTRAVENOUS
  Administered 2022-03-14: 60 mg via INTRAVENOUS

## 2022-03-14 MED ORDER — PROPOFOL 10 MG/ML IV BOLUS
INTRAVENOUS | Status: AC
  Filled 2022-03-14: qty 20

## 2022-03-14 MED ORDER — ONDANSETRON HCL 4 MG/2ML IJ SOLN
INTRAMUSCULAR | Status: AC
Start: 2022-03-14 — End: ?
  Filled 2022-03-14: qty 2

## 2022-03-14 MED ORDER — VANCOMYCIN HCL 1500 MG/300ML IV SOLN
1500.0000 mg | Freq: Once | INTRAVENOUS | Status: AC
  Administered 2022-03-14: 1500 mg via INTRAVENOUS
  Filled 2022-03-14: qty 300

## 2022-03-14 MED ORDER — VANCOMYCIN HCL 1250 MG/250ML IV SOLN
1250.0000 mg | Freq: Two times a day (BID) | INTRAVENOUS | Status: DC
  Administered 2022-03-15 – 2022-03-16 (×3): 1250 mg via INTRAVENOUS
  Filled 2022-03-14 (×3): qty 250

## 2022-03-14 MED ORDER — ACETAMINOPHEN 10 MG/ML IV SOLN
INTRAVENOUS | Status: DC | PRN
  Administered 2022-03-14: 1000 mg via INTRAVENOUS

## 2022-03-14 MED ORDER — SODIUM CHLORIDE 0.9 % IV SOLN: INTRAVENOUS | Status: DC

## 2022-03-14 MED ORDER — DIPHENHYDRAMINE HCL 12.5 MG/5ML PO ELIX
12.5000 mg | ORAL_SOLUTION | ORAL | Status: DC | PRN
  Administered 2022-03-16: 25 mg via ORAL

## 2022-03-14 MED ORDER — TOBRAMYCIN SULFATE 1.2 G IJ SOLR
INTRAMUSCULAR | Status: AC
  Filled 2022-03-14: qty 1.2

## 2022-03-14 MED ORDER — PHENYLEPHRINE 80 MCG/ML (10ML) SYRINGE FOR IV PUSH (FOR BLOOD PRESSURE SUPPORT)
PREFILLED_SYRINGE | INTRAVENOUS | Status: AC
Start: 2022-03-14 — End: ?
  Filled 2022-03-14: qty 10

## 2022-03-14 MED ORDER — TOBRAMYCIN SULFATE 1.2 G IJ SOLR
INTRAMUSCULAR | Status: DC | PRN
  Administered 2022-03-14: 1.2 g via TOPICAL

## 2022-03-14 MED ORDER — PREGABALIN 25 MG PO CAPS
25.0000 mg | ORAL_CAPSULE | Freq: Every day | ORAL | Status: DC
  Administered 2022-03-15 – 2022-03-16 (×2): 25 mg via ORAL
  Filled 2022-03-14 (×2): qty 1

## 2022-03-14 MED ORDER — ESCITALOPRAM OXALATE 10 MG PO TABS
20.0000 mg | ORAL_TABLET | Freq: Every morning | ORAL | Status: DC
  Administered 2022-03-15 – 2022-03-16 (×2): 20 mg via ORAL
  Filled 2022-03-14 (×3): qty 2

## 2022-03-14 MED ORDER — CYCLOBENZAPRINE HCL 10 MG PO TABS: 10.0000 mg | ORAL_TABLET | Freq: Three times a day (TID) | ORAL | Status: DC | PRN

## 2022-03-14 MED ORDER — BUPIVACAINE HCL (PF) 0.5 % IJ SOLN
INTRAMUSCULAR | Status: DC | PRN
  Administered 2022-03-14: 10 mL via PERINEURAL

## 2022-03-14 MED ORDER — 0.9 % SODIUM CHLORIDE (POUR BTL) OPTIME
TOPICAL | Status: DC | PRN
  Administered 2022-03-14: 1000 mL

## 2022-03-14 MED ORDER — VANCOMYCIN HCL 1000 MG IV SOLR
INTRAVENOUS | Status: DC | PRN
  Administered 2022-03-14: 1000 mg via TOPICAL

## 2022-03-14 MED ORDER — CEFAZOLIN SODIUM 1 G IJ SOLR
INTRAMUSCULAR | Status: AC
Start: 2022-03-14 — End: ?
  Filled 2022-03-14: qty 20

## 2022-03-14 MED ORDER — MIDAZOLAM HCL 2 MG/2ML IJ SOLN
INTRAMUSCULAR | Status: DC | PRN
  Administered 2022-03-14 (×2): 1 mg via INTRAVENOUS

## 2022-03-14 MED ORDER — SUGAMMADEX SODIUM 200 MG/2ML IV SOLN
INTRAVENOUS | Status: DC | PRN
  Administered 2022-03-14: 200 mg via INTRAVENOUS

## 2022-03-14 MED ORDER — HYDROMORPHONE HCL 1 MG/ML IJ SOLN
0.2500 mg | INTRAMUSCULAR | Status: DC | PRN
  Administered 2022-03-14: 0.25 mg via INTRAVENOUS

## 2022-03-14 MED ORDER — ORAL CARE MOUTH RINSE: 15.0000 mL | Freq: Once | OROMUCOSAL | Status: AC

## 2022-03-14 MED ORDER — EPHEDRINE 5 MG/ML INJ
INTRAVENOUS | Status: AC
  Filled 2022-03-14: qty 5

## 2022-03-14 MED ORDER — FLUTICASONE PROPIONATE 50 MCG/ACT NA SUSP
1.0000 | Freq: Every day | NASAL | Status: DC
  Administered 2022-03-14 – 2022-03-15 (×2): 1 via NASAL
  Filled 2022-03-14: qty 16

## 2022-03-14 MED ORDER — PHENYLEPHRINE HCL-NACL 20-0.9 MG/250ML-% IV SOLN
INTRAVENOUS | Status: DC | PRN
  Administered 2022-03-14: 15 ug/min via INTRAVENOUS

## 2022-03-14 MED ORDER — LIDOCAINE 2% (20 MG/ML) 5 ML SYRINGE
INTRAMUSCULAR | Status: AC
  Filled 2022-03-14: qty 5

## 2022-03-14 MED ORDER — BUSPIRONE HCL 10 MG PO TABS
10.0000 mg | ORAL_TABLET | Freq: Every day | ORAL | Status: DC
  Administered 2022-03-15 – 2022-03-16 (×2): 10 mg via ORAL
  Filled 2022-03-14 (×2): qty 1

## 2022-03-14 MED ORDER — PREGABALIN 25 MG PO CAPS
50.0000 mg | ORAL_CAPSULE | Freq: Every day | ORAL | Status: DC
  Administered 2022-03-14 – 2022-03-15 (×2): 50 mg via ORAL
  Filled 2022-03-14 (×2): qty 2

## 2022-03-14 MED ORDER — PROPOFOL 10 MG/ML IV BOLUS
INTRAVENOUS | Status: DC | PRN
  Administered 2022-03-14: 160 mg via INTRAVENOUS

## 2022-03-14 MED ORDER — HYDROMORPHONE HCL 1 MG/ML IJ SOLN
0.5000 mg | INTRAMUSCULAR | Status: DC | PRN
  Administered 2022-03-15: 1 mg via INTRAVENOUS
  Filled 2022-03-14: qty 1

## 2022-03-14 MED ORDER — FENTANYL CITRATE (PF) 250 MCG/5ML IJ SOLN
INTRAMUSCULAR | Status: AC
  Filled 2022-03-14: qty 5

## 2022-03-14 MED ORDER — POLYETHYLENE GLYCOL 3350 17 G PO PACK
17.0000 g | PACK | Freq: Every day | ORAL | Status: DC | PRN
Start: 2022-03-14 — End: 2022-03-16

## 2022-03-14 MED ORDER — OXYCODONE HCL 5 MG PO TABS
5.0000 mg | ORAL_TABLET | ORAL | Status: DC | PRN
  Administered 2022-03-14 – 2022-03-15 (×5): 10 mg via ORAL
  Filled 2022-03-14 (×5): qty 2

## 2022-03-14 MED ORDER — BUPIVACAINE LIPOSOME 1.3 % IJ SUSP
INTRAMUSCULAR | Status: DC | PRN
  Administered 2022-03-14: 10 mL via PERINEURAL

## 2022-03-14 MED ORDER — CHLORHEXIDINE GLUCONATE 0.12 % MT SOLN
15.0000 mL | Freq: Once | OROMUCOSAL | Status: AC
Start: 2022-03-14 — End: 2022-03-14
  Administered 2022-03-14: 15 mL via OROMUCOSAL
  Filled 2022-03-14: qty 15

## 2022-03-14 SURGICAL SUPPLY — 61 items
BAG COUNTER SPONGE SURGICOUNT (BAG) ×1 IMPLANT
BIT DRILL 3.8X270 (BIT) IMPLANT
BIT DRILL SHORT 3.2MM (DRILL) IMPLANT
BNDG ELASTIC 3X5.8 VLCR STR LF (GAUZE/BANDAGES/DRESSINGS) IMPLANT
BNDG ELASTIC 4X5.8 VLCR STR LF (GAUZE/BANDAGES/DRESSINGS) IMPLANT
BRUSH SCRUB EZ PLAIN DRY (MISCELLANEOUS) ×2 IMPLANT
CHLORAPREP W/TINT 26 (MISCELLANEOUS) ×1 IMPLANT
COVER SURGICAL LIGHT HANDLE (MISCELLANEOUS) ×2 IMPLANT
DRAPE C-ARM 42X72 X-RAY (DRAPES) ×1 IMPLANT
DRAPE C-ARMOR (DRAPES) ×1 IMPLANT
DRAPE IMP U-DRAPE 54X76 (DRAPES) ×1 IMPLANT
DRAPE INCISE IOBAN 66X45 STRL (DRAPES) ×1 IMPLANT
DRAPE U-SHAPE 47X51 STRL (DRAPES) ×1 IMPLANT
DRESSING MEPILEX FLEX 4X4 (GAUZE/BANDAGES/DRESSINGS) IMPLANT
DRILL SHORT 3.2MM (DRILL) ×2
DRSG ADAPTIC 3X8 NADH LF (GAUZE/BANDAGES/DRESSINGS) ×1 IMPLANT
DRSG MEPILEX BORDER 4X12 (GAUZE/BANDAGES/DRESSINGS) IMPLANT
DRSG MEPILEX FLEX 4X4 (GAUZE/BANDAGES/DRESSINGS) ×2
ELECT REM PT RETURN 9FT ADLT (ELECTROSURGICAL)
ELECTRODE REM PT RTRN 9FT ADLT (ELECTROSURGICAL) IMPLANT
GAUZE PAD ABD 8X10 STRL (GAUZE/BANDAGES/DRESSINGS) ×3 IMPLANT
GAUZE SPONGE 4X4 12PLY STRL (GAUZE/BANDAGES/DRESSINGS) ×1 IMPLANT
GLOVE BIO SURGEON STRL SZ 6.5 (GLOVE) ×3 IMPLANT
GLOVE BIO SURGEON STRL SZ7.5 (GLOVE) ×4 IMPLANT
GLOVE BIOGEL PI IND STRL 6.5 (GLOVE) ×1 IMPLANT
GLOVE BIOGEL PI IND STRL 7.5 (GLOVE) ×1 IMPLANT
GOWN STRL REUS W/ TWL LRG LVL3 (GOWN DISPOSABLE) ×2 IMPLANT
GOWN STRL REUS W/ TWL XL LVL3 (GOWN DISPOSABLE) ×1 IMPLANT
GOWN STRL REUS W/TWL LRG LVL3 (GOWN DISPOSABLE) ×2
GOWN STRL REUS W/TWL XL LVL3 (GOWN DISPOSABLE) ×1
GUIDEROD SS 2.5MMX280MM (ROD) IMPLANT
K-WIRE TROCAR POINT 2.5X280 (WIRE) ×1
KIT BASIN OR (CUSTOM PROCEDURE TRAY) ×1 IMPLANT
KIT TURNOVER KIT B (KITS) ×1 IMPLANT
KWIRE TROCAR POINT 2.5X280 (WIRE) IMPLANT
MANIFOLD NEPTUNE II (INSTRUMENTS) ×1 IMPLANT
NAIL HUM MULTILOC 7X255 LT (Nail) IMPLANT
NS IRRIG 1000ML POUR BTL (IV SOLUTION) ×1 IMPLANT
PACK TOTAL JOINT (CUSTOM PROCEDURE TRAY) ×1 IMPLANT
PAD ARMBOARD 7.5X6 YLW CONV (MISCELLANEOUS) ×2 IMPLANT
PADDING CAST SYNTHETIC 3X4 NS (CAST SUPPLIES) IMPLANT
PADDING WEBRIL 4 STERILE (GAUZE/BANDAGES/DRESSINGS) IMPLANT
ROD REAMING 2.5 (INSTRUMENTS) IMPLANT
SCREW LOCK 4X26 TI (Screw) IMPLANT
SCREW LOCK 4X30 TI (Screw) IMPLANT
SCREW LOCK MULTILOC FT 4.5X46 (Screw) IMPLANT
SCREW TI MULTILOC 4.5X42 (Screw) IMPLANT
STAPLER VISISTAT 35W (STAPLE) ×1 IMPLANT
SUCTION FRAZIER HANDLE 10FR (MISCELLANEOUS) ×1
SUCTION TUBE FRAZIER 10FR DISP (MISCELLANEOUS) ×1 IMPLANT
SUT FIBERWIRE 2-0 18 17.9 3/8 (SUTURE)
SUT MNCRL AB 3-0 PS2 18 (SUTURE) ×1 IMPLANT
SUT VIC AB 0 CT1 27 (SUTURE) ×2
SUT VIC AB 0 CT1 27XBRD ANBCTR (SUTURE) ×2 IMPLANT
SUT VIC AB 2-0 CT1 27 (SUTURE) ×3
SUT VIC AB 2-0 CT1 27XBRD (SUTURE) ×2 IMPLANT
SUT VIC AB 2-0 CT3 27 (SUTURE) IMPLANT
SUTURE FIBERWR 2-0 18 17.9 3/8 (SUTURE) IMPLANT
TRAY FOLEY MTR SLVR 16FR STAT (SET/KITS/TRAYS/PACK) IMPLANT
WATER STERILE IRR 1000ML POUR (IV SOLUTION) ×1 IMPLANT
YANKAUER SUCT BULB TIP NO VENT (SUCTIONS) ×1 IMPLANT

## 2022-03-14 NOTE — Progress Notes (Signed)
Orthopedic Tech Progress Note Patient Details:  Timothy [name] 1970/07/28 [id number]  Patient ID: Timothy [name], male   DOB: 1971/06/17, 51 y.o.   MRN: [id number] No OHF; upper extremity injury. Vernona Rieger 03/14/2022, 7:11 PM

## 2022-03-14 NOTE — Anesthesia Procedure Notes (Signed)
Procedure Name: Intubation Date/Time: 03/14/2022 8:46 AM  Performed by: Betha Loa, CRNAPre-anesthesia Checklist: Patient identified, Emergency Drugs available, Suction available and Patient being monitored Patient Re-evaluated:Patient Re-evaluated prior to induction Oxygen Delivery Method: Circle System Utilized Preoxygenation: Pre-oxygenation with 100% oxygen Induction Type: IV induction Ventilation: Mask ventilation without difficulty Laryngoscope Size: Mac and 4 Grade View: Grade II Tube type: Oral Tube size: 7.5 mm Number of attempts: 1 Airway Equipment and Method: Stylet and Oral airway Placement Confirmation: ETT inserted through vocal cords under direct vision, positive ETCO2 and breath sounds checked- equal and bilateral Secured at: 22 cm Tube secured with: Tape Dental Injury: Teeth and Oropharynx as per pre-operative assessment

## 2022-03-14 NOTE — Anesthesia Preprocedure Evaluation (Signed)
Anesthesia Evaluation  Patient identified by MRN, date of birth, ID band Patient awake    Reviewed: Allergy & Precautions, NPO status , Patient's Chart, lab work & pertinent test results  Airway Mallampati: II  TM Distance: >3 FB Neck ROM: Full    Dental  (+) Teeth Intact, Dental Advisory Given   Pulmonary former smoker,    Pulmonary exam normal breath sounds clear to auscultation       Cardiovascular Exercise Tolerance: Good negative cardio ROS Normal cardiovascular exam Rhythm:Regular Rate:Normal     Neuro/Psych PSYCHIATRIC DISORDERS Anxiety Depression    GI/Hepatic GERD  ,(+) Hepatitis - (- Remote Hx)  Endo/Other    Renal/GU      Musculoskeletal   Abdominal (+) + obese (BMI 33.9),   Peds  Hematology   Anesthesia Other Findings   Reproductive/Obstetrics                             Anesthesia Physical  Anesthesia Plan  ASA: 2  Anesthesia Plan: General   Post-op Pain Management: Regional block*, Tylenol PO (pre-op)* and Celebrex PO (pre-op)*   Induction: Intravenous  PONV Risk Score and Plan: 3 and Midazolam, Treatment may vary due to age or medical condition and Ondansetron  Airway Management Planned: Oral ETT  Additional Equipment: None  Intra-op Plan:   Post-operative Plan: Extubation in OR  Informed Consent: I have reviewed the patients History and Physical, chart, labs and discussed the procedure including the risks, benefits and alternatives for the proposed anesthesia with the patient or authorized representative who has indicated his/her understanding and acceptance.     Dental advisory given  Plan Discussed with: CRNA  Anesthesia Plan Comments:         Anesthesia Quick Evaluation

## 2022-03-14 NOTE — Plan of Care (Signed)
  Problem: Education: Goal: Knowledge of General Education information will improve Description: Including pain rating scale, medication(s)/side effects and non-pharmacologic comfort measures Outcome: Progressing   Problem: Health Behavior/Discharge Planning: Goal: Ability to manage health-related needs will improve Outcome: Progressing   Problem: Clinical Measurements: Goal: Ability to maintain clinical measurements within normal limits will improve Outcome: Progressing   Problem: Activity: Goal: Risk for activity intolerance will decrease Outcome: Progressing   Problem: Pain Managment: Goal: General experience of comfort will improve Outcome: Progressing   Problem: Safety: Goal: Ability to remain free from injury will improve Outcome: Progressing

## 2022-03-14 NOTE — Op Note (Signed)
Orthopaedic Surgery Operative Note (CSN: [id number] ) Date of Surgery: 03/14/2022  Admit Date: 03/14/2022   Diagnoses: Pre-Op Diagnoses: Left humeral shaft nonunion  Post-Op Diagnosis: Same  Procedures: CPT 24430-Repair of left humeral shaft nonunion CPT 20680-Removal of left humerus hardware  Surgeons : Primary: Shona Needles, MD  Assistant: Patrecia Pace, PA-C  Location: OR 7   Anesthesia:General with regional block   Antibiotics: Ancef 2g after cultures taken and 1 gm vancomycin powder and 1.2gm tobramycin powder placed topically.   Tourniquet time:None used    Estimated Blood TOIZ:124 mL  Complications:* No complications entered in OR log *   Specimens: ID Type Source Tests Collected by Time Destination  A : left humeral non union #1 Body Fluid Other Source AEROBIC/ANAEROBIC CULTURE W GRAM STAIN (SURGICAL/DEEP WOUND) Shona Needles, MD 03/14/2022 0915   B : left humeral non union # 2 Body Fluid Other Source AEROBIC/ANAEROBIC CULTURE W GRAM STAIN (SURGICAL/DEEP WOUND) Shona Needles, MD 03/14/2022 [phone]   C : left humeral non union # 3 Tissue Other Source AEROBIC/ANAEROBIC CULTURE W GRAM STAIN (SURGICAL/DEEP WOUND) Shona Needles, MD 03/14/2022 0920   D : left humeral non union # 4 Tissue Other Source AEROBIC/ANAEROBIC CULTURE W GRAM STAIN (SURGICAL/DEEP WOUND) Shona Needles, MD 03/14/2022 0920   E : left humeral non union # 5 Tissue Other Source AEROBIC/ANAEROBIC CULTURE W GRAM STAIN (SURGICAL/DEEP WOUND) Shona Needles, MD 03/14/2022 0920      Implants: Implant Name Type Inv. Item Serial No. Manufacturer Lot No. LRB No. Used Action  NAIL HUM MULTILOC [id number] LT - [id number] Nail NAIL HUM MULTILOC 7X255 LT  [employer] [id number] Left 1 Implanted  SCREW LOCK MULTILOC FT 4.5X46 - [id number] Screw SCREW LOCK MULTILOC FT 4.5X46  [employer]  Left 1 Implanted  SCREW TI MULTILOC 4.5X42 - [id number] Screw SCREW TI MULTILOC 4.5X42  [employer]  Left 1 Implanted   SCREW LOCK 4X26 TI - [id number] Screw SCREW LOCK 4X26 TI  [employer]  Left 1 Implanted  SCREW LOCK 4X30 TI - [id number] Screw SCREW LOCK 4X30 TI  [employer]  Left 1 Implanted     Indications for Surgery: 51 year old male who sustained a left humeral shaft fracture was initially tried to treat nonoperatively it eventually underwent open reduction internal fixation.  He fell and failed his fixation underwent revision fixation in May of this year.  Unfortunately he had persistent nonunion with loss of fixation and hardware.  There was concern regarding a potential infection.  His inflammatory markers were within normal limits.  I discussed risks and benefits of proceeding with debridement removal of hardware and possible intramedullary fixation.  Risks include but not limited to bleeding, infection, malunion, persistent nonunion, hardware failure, hardware irritation, nerve or blood vessel injury, DVT, even possibility anesthetic complications.  They agreed to proceed with surgery and consent was obtained.  Operative Findings: 1.  Removal of previous humeral shaft hardware with no signs of gross purulence.  Multiple cultures were obtained please see operative report below for details regarding each of the cultures. 2.  Debridement of the nonunion site with intramedullary nailing of left humeral nonunion using Synthes Humeral Multiloc humeral nail 7 x 255 mm  Procedure: The patient was identified in the preoperative holding area. Consent was confirmed with the patient and their family and all questions were answered. The operative extremity was marked after confirmation with the patient. he was then brought back to the operating room by our anesthesia colleagues.  He was placed under general anesthetic and carefully transferred over to radiolucent flat top table.  A bump was placed under his operative shoulder.  The left upper extremity was then prepped and draped in usual sterile fashion.   A timeout was performed to verify the patient, the procedure, and the extremity.  Antibiotics were held until cultures were obtained.  Fluoroscopic imaging showed the unstable nature of his nonunion.  A incision through his previous anterior lateral scar was made and carried down through skin and subcutaneous tissue.  I incised through the scar and muscle to expose the plate and screws.  The plate and screws was loose and had no neuro fixation into the bone.  I was able to successfully remove all 8 screws and was able to remove the plate.  A culture #1 was fluid from the nonunion site that we sent.  I then used a Cobb elevator to debride the fibrinous tissue between the plate and the bone.  Cultures #2 and 3 where there is fibrinous tissue that was sent for microbiology.  I then curetted each of the screw holes and sent the fibrinous tissue within the screw holes as culture #4.  I then debrided the nonunion site to remove the fibrinous tissue.  This tissue along with intramedullary reamings consistent of the #5 culture.  I then irrigated the wound and the nonunion site.  I felt that I could proceed with intramedullary nailing of the humerus.  Gloves and instruments were then changed.  I made a small incision anterior lateral to the acromion and I split the deltoid in line with my incision.  I then exposed the rotator cuff and used a 15 blade to incise through the rotator cuff.  I kept the attachment in place.  I then used a guidewire to find the appropriate starting point.  It was advanced into the metaphysis and I used an awl to enter the medullary canal.  A ball-tipped guidewire was then sent down the center of the canal and seated it into the distal segment.  I then measured the length and chose to use a 250 mm nail.  I sequentially reamed from 6 mm to 8.5 mm and had good chatter.  The reamings were sent for culture.  I decided to place a 7 mm nail.  The nail was attached to the targeting arm and placed  down the center of the canal.  I then used a reduction tenaculum to clamp the nonunion site and compress it while the nail was being placed.  I then used the targeting arm to place 2 proximal interlocking screws.  Perfect circle technique through the anterior lateral incision was used to placed distal interlocking screws from anterior to posterior.  The targeting arm was removed.  The clamp was removed.  Final fluoroscopic imaging was obtained.  The incisions were copiously irrigated.  A gram of vancomycin powder and 1.2 g of tobramycin powder were placed into the incision.  Layered closure of 0 Vicryl, 2-0 Vicryl and 3-0 Monocryl was used for the anterior lateral approach.  The rotator cuff was closed with 0 Vicryl suture.  The deltoid fascia was closed with 0 Vicryl suture.  The remainder were closed with 2-0 Vicryl and 3-0 Monocryl.  Dermabond was used to seal the skin.  Sterile dressing was applied.  The patient was then awoken from anesthesia and taken to the PACU in stable condition.  Post Op Plan/Instructions: The patient will be admitted overnight for observation.  We  will place him on broad-spectrum antibiotics for overnight follow-up cultures in the morning.  We will determine antibiotic regiment after cultures returned in the morning.  He will be nonweightbearing to the left upper extremity.  No DVT prophylaxis is needed in this ambulatory patient.  I was present and performed the entire surgery.  Patrecia Pace, PA-C did assist me throughout the case. An assistant was necessary given the difficulty in approach, maintenance of reduction and ability to instrument the fracture.   Katha Hamming, MD [facility]

## 2022-03-14 NOTE — Transfer of Care (Signed)
Immediate Anesthesia Transfer of Care Note  Patient: Timothy [name]  Procedure(s) Performed: REPAIR OF HUMERAL NONUNION (Left)  Patient Location: PACU  Anesthesia Type:General and Regional  Level of Consciousness: awake, alert , oriented, patient cooperative and responds to stimulation  Airway & Oxygen Therapy: Patient Spontanous Breathing and Patient connected to nasal cannula oxygen  Post-op Assessment: Report given to RN and Post -op Vital signs reviewed and stable  Post vital signs: Reviewed and stable  Last Vitals:  Vitals Value Taken Time  BP 118/71 03/14/22 1115  Temp    Pulse 80 03/14/22 1117  Resp 9 03/14/22 1117  SpO2 92 % 03/14/22 1117  Vitals shown include unvalidated device data.  Last Pain:  Vitals:   03/14/22 0725  TempSrc:   PainSc: 4       Patients Stated Pain Goal: 3 (49/49/44 7395)  Complications: No notable events documented.

## 2022-03-14 NOTE — Anesthesia Procedure Notes (Signed)
Anesthesia Regional Block: Interscalene brachial plexus block   Pre-Anesthetic Checklist: , timeout performed,  Correct Patient, Correct Site, Correct Laterality,  Correct Procedure, Correct Position, site marked,  Risks and benefits discussed,  Surgical consent,  Pre-op evaluation,  At surgeon's request and post-op pain management  Laterality: Upper and Left  Prep: chloraprep       Needles:  Injection technique: Single-shot  Needle Type: Stimulator Needle - 40     Needle Length: 4cm  Needle Gauge: 22     Additional Needles:   Procedures:,,,, ultrasound used (permanent image in chart),,    Narrative:  Start time: 03/14/2022 8:03 AM End time: 03/14/2022 8:23 AM Injection made incrementally with aspirations every 5 mL.  Performed by: Personally  Anesthesiologist: Nolon Nations, MD  Additional Notes: BP cuff, SpO2 and EKG monitors applied. Sedation begun. Nerve location verified with ultrasound. Anesthetic injected incrementally, slowly, and after neg aspirations under direct u/s guidance. Good perineural spread. Tolerated well.

## 2022-03-14 NOTE — Interval H&P Note (Signed)
History and Physical Interval Note:  03/14/2022 8:30 AM  Timothy [name]  has presented today for surgery, with the diagnosis of Left humerus nonunion.  The various methods of treatment have been discussed with the patient and family. After consideration of risks, benefits and other options for treatment, the patient has consented to  Procedure(s): [place] (Left) as a surgical intervention.  The patient's history has been reviewed, patient examined, no change in status, stable for surgery.  I have reviewed the patient's chart and labs.  Questions were answered to the patient's satisfaction.     Lennette Bihari P Whittaker Lenis

## 2022-03-14 NOTE — Progress Notes (Signed)
Pharmacy Antibiotic Note  Timothy [name] is a 51 y.o. male admitted on 03/14/2022 with  fracture non-union .  Pharmacy has been consulted for vanc dosing.  Pt presented for surgery to repair is non-union fracture. Cultures were sent to r/o infection. Empiric vanc/ceftriaxone ordered.  Scr <1  Plan: Ceftriaxone 2g IV q24 Vanc 1.5g IV x1 then '1250mg'$  IV q12 Levels as needed  Height: 5' 6.5" (168.9 cm) Weight: 109.3 kg (241 lb) IBW/kg (Calculated) : 64.95  Temp (24hrs), Avg:98.1 F (36.7 C), Min:97.9 F (36.6 C), Max:98.2 F (36.8 C)  Recent Labs  Lab 03/14/22 0720  WBC 7.4  CREATININE 0.63    Estimated Creatinine Clearance: 126.3 mL/min (by C-G formula based on SCr of 0.63 mg/dL).    Allergies  Allergen Reactions   Nsaids Other (See Comments)    non union    Percocet [Oxycodone-Acetaminophen] Other (See Comments)    Pt states he stays awake for a very long period of time. No problems with the 5/325 only can take 1 a day   Tetanus Toxoids Swelling   Shellfish-Derived Products Rash    Clams and Oysters    Antimicrobials this admission: 9/6 ceftriaxone>> 9/6 vanc>>  Dose adjustments this admission:   Microbiology results: 9/6 tissue cx>>  Onnie Boer, PharmD, BCIDP, AAHIVP, CPP Infectious Disease Pharmacist 03/14/2022 4:56 PM

## 2022-03-15 ENCOUNTER — Encounter (HOSPITAL_COMMUNITY): Payer: Self-pay | Admitting: Student

## 2022-03-15 DIAGNOSIS — S42302K Unspecified fracture of shaft of humerus, left arm, subsequent encounter for fracture with nonunion: Secondary | ICD-10-CM | POA: Diagnosis not present

## 2022-03-15 LAB — CBC
HCT: 35 % — ABNORMAL LOW (ref 39.0–52.0)
Hemoglobin: 11.7 g/dL — ABNORMAL LOW (ref 13.0–17.0)
MCH: 30.6 pg (ref 26.0–34.0)
MCHC: 33.4 g/dL (ref 30.0–36.0)
MCV: 91.6 fL (ref 80.0–100.0)
Platelets: 206 10*3/uL (ref 150–400)
RBC: 3.82 MIL/uL — ABNORMAL LOW (ref 4.22–5.81)
RDW: 12.7 % (ref 11.5–15.5)
WBC: 9.4 10*3/uL (ref 4.0–10.5)
nRBC: 0 % (ref 0.0–0.2)

## 2022-03-15 LAB — BASIC METABOLIC PANEL
Anion gap: 9 (ref 5–15)
BUN: 12 mg/dL (ref 6–20)
CO2: 23 mmol/L (ref 22–32)
Calcium: 8.3 mg/dL — ABNORMAL LOW (ref 8.9–10.3)
Chloride: 104 mmol/L (ref 98–111)
Creatinine, Ser: 0.69 mg/dL (ref 0.61–1.24)
GFR, Estimated: >60 mL/min (ref >60)
Glucose, Bld: 109 mg/dL — ABNORMAL HIGH (ref 70–99)
Potassium: 4.1 mmol/L (ref 3.5–5.1)
Sodium: 136 mmol/L (ref 135–145)

## 2022-03-15 LAB — HEMOGLOBIN A1C
Hgb A1c MFr Bld: 5.5 % (ref 4.8–5.6)
Mean Plasma Glucose: 111.15 mg/dL

## 2022-03-15 LAB — VITAMIN D 25 HYDROXY (VIT D DEFICIENCY, FRACTURES): Vit D, 25-Hydroxy: 29.96 ng/mL — ABNORMAL LOW (ref 30–100)

## 2022-03-15 LAB — GLUCOSE, CAPILLARY: Glucose-Capillary: 76 mg/dL (ref 70–99)

## 2022-03-15 MED ORDER — VITAMIN D 25 MCG (1000 UNIT) PO TABS
1000.0000 [IU] | ORAL_TABLET | Freq: Every day | ORAL | Status: DC
  Administered 2022-03-15 – 2022-03-16 (×2): 1000 [IU] via ORAL
  Filled 2022-03-15 (×2): qty 1

## 2022-03-15 NOTE — Evaluation (Signed)
Occupational Therapy Evaluation Patient Details Name: Timothy [name] MRN: [id number] DOB: [birth year] Today's Date: 03/15/2022   History of Present Illness Pt is a 51 yr old male who had a fx of L humerous in 12/22 with conservervative management but then had an ORIF and a bone graft completed in 2/23 and had a fall in 5/23 landing on the LUE. Pt s/p on 03/13/22 repair of humeral nonunion and removal of hardware. PMH: anxiety, GERD, hepatitis, panic attack, prostate ca   Clinical Impression   Pt at this time plans to dc to an extended stay hotel with wife but not sure about the set up. Pt at this time was educated to call the hotel to see the layout of the room and to see if they could provide an ADA compliant room. Pt requires mod assist for UE and LE ADLS at this time. Pt did require cue on pacing self with any sit to stand transfers and bed mobility within session. Pt currently with functional limitations due to the deficits listed below (see OT Problem List).  Pt will benefit from skilled OT to increase their safety and independence with ADL and functional mobility for ADL to facilitate discharge to venue listed below.        Recommendations for follow up therapy are one component of a multi-disciplinary discharge planning process, led by the attending physician.  Recommendations may be updated based on patient status, additional functional criteria and insurance authorization.   Follow Up Recommendations  Follow physician's recommendations for discharge plan and follow up therapies (OP services when able to complete)    Assistance Recommended at Discharge Intermittent Supervision/Assistance  Patient can return home with the following A little help with walking and/or transfers;A little help with bathing/dressing/bathroom;Assistance with cooking/housework;Direct supervision/assist for medications management;Direct supervision/assist for financial management    Functional Status  Assessment  Patient has had a recent decline in their functional status and demonstrates the ability to make significant improvements in function in a reasonable and predictable amount of time.  Equipment Recommendations  None recommended by OT (However, if they are able to get more of an idea on how the set up in the hotel is going to be may need shower seat.)    Recommendations for Other Services       Precautions / Restrictions Precautions Precautions: Fall Precaution Comments: pt reported first fall was from slipping on a deck then the second fall they are unsure on what occured but near back step of pourch Restrictions Weight Bearing Restrictions: Yes LUE Weight Bearing: Non weight bearing , sling for comfort     Mobility Bed Mobility Overal bed mobility: Needs Assistance Bed Mobility: Supine to Sit     Supine to sit: Supervision     General bed mobility comments: cues on pacing self as did report when powering up they reported some dizziness    Transfers Overall transfer level: Needs assistance Equipment used: None Transfers: Sit to/from Stand Sit to Stand: Supervision (cues on pacing)                  Balance Overall balance assessment: Mild deficits observed, not formally tested                                         ADL either performed or assessed with clinical judgement   ADL Overall ADL's : Needs assistance/impaired Eating/Feeding: Set up;Sitting  Grooming: Wash/dry hands;Wash/dry face;Set up;Sitting;Standing   Upper Body Bathing: Moderate assistance;Cueing for safety;Cueing for sequencing;Sitting   Lower Body Bathing: Cueing for safety;Cueing for sequencing;Sit to/from stand;Minimal assistance   Upper Body Dressing : Moderate assistance;Cueing for safety;Cueing for sequencing;Sitting;Standing   Lower Body Dressing: Minimal assistance;Cueing for safety;Cueing for sequencing;Sit to/from stand   Toilet Transfer:  Supervision/safety;Cueing for safety;Cueing for sequencing   Toileting- Clothing Manipulation and Hygiene: Min guard;Cueing for safety;Cueing for sequencing;Sit to/from stand       Functional mobility during ADLs: Min guard       Vision         Perception     Praxis      Pertinent Vitals/Pain Pain Assessment Pain Assessment: 0-10 Pain Score: 4  Pain Location: L shoulder Pain Descriptors / Indicators: Aching Pain Intervention(s): Limited activity within patient's tolerance, Monitored during session, Premedicated before session, Repositioned, Ice applied     Hand Dominance Left   Extremity/Trunk Assessment Upper Extremity Assessment Upper Extremity Assessment: LUE deficits/detail LUE Deficits / Details: limited due to s/p sx, some numbness in LUE, increased in edema, able to complete FM movements but also limited due to ace bandages LUE Sensation: decreased light touch LUE Coordination: decreased fine motor;decreased gross motor   Lower Extremity Assessment Lower Extremity Assessment: Defer to PT evaluation       Communication Communication Communication: No difficulties   Cognition Arousal/Alertness: Awake/alert Behavior During Therapy: WFL for tasks assessed/performed Overall Cognitive Status: Within Functional Limits for tasks assessed                                       General Comments       Exercises     Shoulder Instructions      Home Living Family/patient expects to be discharged to:: Other (Comment) (Pt staying at an extended hotel stay for about 3 weeks) Living Arrangements: Spouse/significant other Available Help at Discharge: Family;Available PRN/intermittently               Bathroom Shower/Tub:  (unsure due to hotel stay but it was advised about seeing if they can stay in an ADA room)   Bathroom Toilet:  (unsure with hotel)     Home Equipment: None   Additional Comments: Pt moving to new house after hotel stay  with 3 stairs to enter (railing on both sides). Will be staying on first level at hotel.      Prior Functioning/Environment Prior Level of Function : Independent/Modified Independent;Driving;History of Falls (last six months)             Mobility Comments: Does not AD. ADLs Comments: Requires assistance occasionally for showering due to L shoulder deficits.        OT Problem List: Decreased strength;Decreased range of motion;Decreased activity tolerance;Impaired balance (sitting and/or standing);Decreased safety awareness;Decreased knowledge of use of DME or AE;Cardiopulmonary status limiting activity;Pain;Impaired UE functional use      OT Treatment/Interventions: Self-care/ADL training;Therapeutic exercise;DME and/or AE instruction;Therapeutic activities;Patient/family education;Balance training    OT Goals(Current goals can be found in the care plan section) Acute Rehab OT Goals Patient Stated Goal: to get use of LUE back OT Goal Formulation: With patient Time For Goal Achievement: 03/29/22 Potential to Achieve Goals: Good ADL Goals Pt Will Perform Eating: with modified independence;sitting Pt Will Perform Grooming: with modified independence;sitting Pt Will Perform Upper Body Bathing: with min guard assist;sitting Pt Will Perform Lower Body Bathing:  with supervision;sit to/from stand Pt Will Transfer to Toilet: with modified independence;ambulating  OT Frequency: Min 3X/week    Co-evaluation              AM-PAC OT "6 Clicks" Daily Activity     Outcome Measure Help from another person eating meals?: A Little Help from another person taking care of personal grooming?: A Little Help from another person toileting, which includes using toliet, bedpan, or urinal?: A Little Help from another person bathing (including washing, rinsing, drying)?: A Lot Help from another person to put on and taking off regular upper body clothing?: A Lot Help from another person to put on  and taking off regular lower body clothing?: A Lot 6 Click Score: 15   End of Session Equipment Utilized During Treatment: Gait belt Nurse Communication: Mobility status  Activity Tolerance: Patient tolerated treatment well Patient left: in chair;with call bell/phone within reach;with chair alarm set  OT Visit Diagnosis: Unsteadiness on feet (R26.81);Other abnormalities of gait and mobility (R26.89);Repeated falls (R29.6);Muscle weakness (generalized) (M62.81);History of falling (Z91.81);Pain Pain - Right/Left: Left Pain - part of body: Shoulder                Time: 8832-5498 OT Time Calculation (min): 42 min Charges:  OT General Charges $OT Visit: 1 Visit OT Evaluation $OT Eval Low Complexity: 1 Low OT Treatments $Self Care/Home Management : 23-37 mins  Joeseph Amor OTR/L  Acute Rehab Services  [phone] office number [phone] pager number   Joeseph Amor 03/15/2022, 10:58 AM

## 2022-03-15 NOTE — Evaluation (Addendum)
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 Pt has PMH of anxiety, hepatitis, GERD, and prostate cancer.  Clinical Impression  Pt performing all functional mobility at independent level. Pt appears to be at his baseline. Pt ambulated 550 feet without difficulty. No further acute care physical therapy indicated.      Recommendations for follow up therapy are one component of a multi-disciplinary discharge planning process, led by the attending physician.  Recommendations may be updated based on patient status, additional functional criteria and insurance authorization.  Follow Up Recommendations Follow physician's recommendations for discharge plan and follow up therapies (for L shoulder)      Assistance Recommended at Discharge PRN  Patient can return home with the following  Assist for transportation;A little help with bathing/dressing/bathroom;Assistance with cooking/housework    Equipment Recommendations None recommended by PT  Recommendations for Other Services       Functional Status Assessment Patient has not had a recent decline in their functional status     Precautions / Restrictions Precautions Precautions: Fall Precaution Comments: pt reported first fall was from slipping on a deck then the second fall they are unsure on what occured but near back step of pourch Required Braces or Orthoses: Sling Restrictions Weight Bearing Restrictions: Yes LUE Weight Bearing: Non weight bearing      Mobility  Bed Mobility               General bed mobility comments: Supervision per OT eval    Transfers Overall transfer level: Independent Equipment used: None Transfers: Sit to/from Stand Sit to Stand: Independent                 Ambulation/Gait Ambulation/Gait assistance: Independent Gait Distance (Feet): 550 Feet Assistive device: None Gait Pattern/deviations: WFL(Within Functional Limits)   Gait velocity interpretation: >2.62 ft/sec, indicative of community ambulatory   General Gait Details: Pt with slightly increased BOS 2/2 bilateral foot ER (likely pt's baseline and structural). No LOB.  Stairs            Wheelchair Mobility    Modified Rankin (Stroke Patients Only)       Balance Overall balance assessment: Independent (Pt with good stability during FTEO and semi-tandem stance each side. Pt with difficulty during tandem stance each side on first attempt but able to decrease level of unsteadiness on second attempt. Pt able to hold SLS on each limb for several seconds)                                           Pertinent Vitals/Pain Pain Assessment Pain Assessment: 0-10 Pain Score: 3  Pain Location: L shoulder Pain Descriptors / Indicators: Aching Pain Intervention(s): Limited activity within patient's tolerance, Monitored during session    Home Living Family/patient expects to be discharged to:: Other (Comment) (Pt staying at an extended hotel stay for about 3 weeks) Living Arrangements: Spouse/significant other Available Help at Discharge: Family;Available PRN/intermittently             Home Equipment: None Additional Comments: Pt moving to new house after hotel stay with 3 stairs to enter (railing on both sides). Will be staying on first level at hotel.    Prior Function Prior  Level of Function : Independent/Modified Independent;Driving;History of Falls (last six months)             Mobility Comments: Does not AD. ADLs Comments: Requires assistance occasionally for showering due to L shoulder deficits.     Hand Dominance   Dominant Hand: Left    Extremity/Trunk Assessment   Upper Extremity Assessment Upper Extremity Assessment: LUE  deficits/detail LUE Deficits / Details: limited due to s/p sx, some numbness in LUE, increased in edema, able to complete FM movements but also limited due to ace bandages LUE Sensation: decreased light touch LUE Coordination: decreased fine motor;decreased gross motor    Lower Extremity Assessment Lower Extremity Assessment: Defer to PT evaluation       Communication   Communication: No difficulties  Cognition Arousal/Alertness: Awake/alert Behavior During Therapy: WFL for tasks assessed/performed Overall Cognitive Status: Within Functional Limits for tasks assessed                                          General Comments      Exercises     Assessment/Plan    PT Assessment Patient does not need any further PT services  PT Problem List         PT Treatment Interventions      PT Goals (Current goals can be found in the Care Plan section)  Acute Rehab PT Goals Patient Stated Goal: n/a    Frequency       Co-evaluation               AM-PAC PT "6 Clicks" Mobility  Outcome Measure Help needed turning from your back to your side while in a flat bed without using bedrails?: None Help needed moving from lying on your back to sitting on the side of a flat bed without using bedrails?: None Help needed moving to and from a bed to a chair (including a wheelchair)?: None Help needed standing up from a chair using your arms (e.g., wheelchair or bedside chair)?: None Help needed to walk in hospital room?: None Help needed climbing 3-5 steps with a railing? : None 6 Click Score: 24    End of Session   Activity Tolerance: Patient tolerated treatment well Patient left: in chair;with call bell/phone within reach Nurse Communication: Mobility status PT Visit Diagnosis: Pain Pain - Right/Left: Left Pain - part of body: Shoulder    Time: 2458-0998 PT Time Calculation (min) (ACUTE ONLY): 20 min   Charges:   PT Evaluation $PT Eval Low Complexity: 1  Low          Donna Bernard, PT   [facility] 03/15/2022, 11:13 AM

## 2022-03-15 NOTE — Anesthesia Postprocedure Evaluation (Signed)
Anesthesia Post Note  Patient: Timothy [name]  Procedure(s) Performed: REPAIR OF HUMERAL NONUNION (Left)     Patient location during evaluation: PACU Anesthesia Type: General Level of consciousness: sedated and patient cooperative Pain management: pain level controlled Vital Signs Assessment: post-procedure vital signs reviewed and stable Respiratory status: spontaneous breathing Cardiovascular status: stable Anesthetic complications: no   No notable events documented.  Last Vitals:  Vitals:   03/15/22 0905 03/15/22 1443  BP: 111/75 120/70  Pulse: 90 82  Resp: 18 18  Temp: (!) 36.4 C 37.2 C  SpO2: 96% 91%    Last Pain:  Vitals:   03/15/22 1443  TempSrc: Oral  PainSc:                  Nolon Nations

## 2022-03-15 NOTE — Progress Notes (Signed)
Orthopaedic Trauma Progress Note  SUBJECTIVE: Doing well this morning.  Had a large pain spike around 11:30 PM as regional nerve block began wearing off but pain is much better controlled this morning.  Has some baseline tingling in the fingers which was present prior to surgery.  Otherwise denies any numbness throughout the arm.  No chest pain. No SOB. No nausea/vomiting. No other complaints.  Has been up out of bed ambulating to the bathroom this morning, did well with this.  2/5 intraoperative cultures showing WBCs present, but no specific organism seen at this time.    OBJECTIVE:  Vitals:   03/14/22 1642 03/14/22 2114  BP: 124/85 110/73  Pulse: 95 95  Resp: 18   Temp: 98.2 F (36.8 C) 99.5 F (37.5 C)  SpO2: 99% 98%    General: Sitting up in bed, no acute distress Respiratory: No increased work of breathing.  Left upper extremity: Ice pack in place over the shoulder.  Dressing clean, dry, intact.  Sling in place.  No significant tenderness with palpation over the shoulder.  Mildly tender throughout the humerus as expected.  Tolerates very gentle elbow range of motion.  Wrist flexion extension is intact.  Able to wiggle each of his fingers.  Able to make a full fist.  Motor and sensory function is intact through the median, ulnar, radial nerve distribution.  Has some decreased sensation with light touch over the shoulder but endorses sensation throughout remainder of extremity.  Compartment soft and compressible.  Hand warm and well-perfused.  IMAGING: Stable post op imaging.   LABS:  Results for orders placed or performed during the hospital encounter of 03/14/22 (from the past 24 hour(s))  Aerobic/Anaerobic Culture w Gram Stain (surgical/deep wound)     Status: None (Preliminary result)   Collection Time: 03/14/22  9:15 AM   Specimen: Other Source; Body Fluid  Result Value Ref Range   Specimen Description OTHER    Special Requests LEFT HUMERAL 1    Gram Stain NO WBC SEEN NO  ORGANISMS SEEN     Culture      NO GROWTH < 24 HOURS Performed at [facility], [address], [place], [place] [address]    Report Status PENDING   Aerobic/Anaerobic Culture w Gram Stain (surgical/deep wound)     Status: None (Preliminary result)   Collection Time: 03/14/22  9:18 AM   Specimen: Other Source; Body Fluid  Result Value Ref Range   Specimen Description OTHER    Special Requests  LEFT HUMERAL 2    Gram Stain      ABUNDANT WBC PRESENT,BOTH PMN AND MONONUCLEAR NO ORGANISMS SEEN    Culture      NO GROWTH < 24 HOURS Performed at [facility], [address] [place], [place] [address]    Report Status PENDING   Aerobic/Anaerobic Culture w Gram Stain (surgical/deep wound)     Status: None (Preliminary result)   Collection Time: 03/14/22  9:20 AM   Specimen: Other Source; Tissue  Result Value Ref Range   Specimen Description OTHER    Special Requests  LEFT HUMERAL 3    Gram Stain      FEW WBC PRESENT,BOTH PMN AND MONONUCLEAR NO ORGANISMS SEEN    Culture      CULTURE REINCUBATED FOR BETTER GROWTH Performed at [facility], [place] [address] [place], [place] [address]    Report Status PENDING   Aerobic/Anaerobic Culture w Gram Stain (surgical/deep wound)  Status: None (Preliminary result)   Collection Time: 03/14/22  9:20 AM   Specimen: Other Source; Tissue  Result Value Ref Range   Specimen Description OTHER    Special Requests  LEFT HUMERAL 4    Gram Stain NO ORGANISMS SEEN NO WBC SEEN     Culture      NO GROWTH < 24 HOURS Performed at [facility], [place] [address] [place], [place] [address]    Report Status PENDING   Aerobic/Anaerobic Culture w Gram Stain (surgical/deep wound)     Status: None (Preliminary result)   Collection Time: 03/14/22  9:20 AM   Specimen: Other Source; Tissue  Result Value Ref Range   Specimen Description OTHER    Special Requests  LEFT HUMERAL 5    Gram Stain NO ORGANISMS SEEN NO WBC SEEN      Culture      NO GROWTH < 24 HOURS Performed at [facility], [place] [address] [place], [place] [address]    Report Status PENDING   Glucose, capillary     Status: Abnormal   Collection Time: 03/14/22  8:08 PM  Result Value Ref Range   Glucose-Capillary 124 (H) 70 - 99 mg/dL  CBC     Status: Abnormal   Collection Time: 03/15/22  1:05 AM  Result Value Ref Range   WBC 9.4 4.0 - 10.5 K/uL   RBC 3.82 (L) 4.22 - 5.81 MIL/uL   Hemoglobin 11.7 (L) 13.0 - 17.0 g/dL   HCT 35.0 (L) 39.0 - 52.0 %   MCV 91.6 80.0 - 100.0 fL   MCH 30.6 26.0 - 34.0 pg   MCHC 33.4 30.0 - 36.0 g/dL   RDW 12.7 11.5 - 15.5 %   Platelets 206 150 - 400 K/uL   nRBC 0.0 0.0 - 0.2 %  Basic metabolic panel     Status: Abnormal   Collection Time: 03/15/22  1:05 AM  Result Value Ref Range   Sodium 136 135 - 145 mmol/L   Potassium 4.1 3.5 - 5.1 mmol/L   Chloride 104 98 - 111 mmol/L   CO2 23 22 - 32 mmol/L   Glucose, Bld 109 (H) 70 - 99 mg/dL   BUN 12 6 - 20 mg/dL   Creatinine, Ser 0.69 0.61 - 1.24 mg/dL   Calcium 8.3 (L) 8.9 - 10.3 mg/dL   GFR, Estimated >60 >60 mL/min   Anion gap 9 5 - 15  Hemoglobin A1c     Status: None   Collection Time: 03/15/22  1:05 AM  Result Value Ref Range   Hgb A1c MFr Bld 5.5 4.8 - 5.6 %   Mean Plasma Glucose 111.15 mg/dL  VITAMIN D 25 Hydroxy (Vit-D Deficiency, Fractures)     Status: Abnormal   Collection Time: 03/15/22  1:05 AM  Result Value Ref Range   Vit D, 25-Hydroxy 29.96 (L) 30 - 100 ng/mL    ASSESSMENT: Timothy [name] is a 51 y.o. male, 1 Day Post-Op s/p REPAIR OF HUMERAL NONUNION  CV/Blood loss: Acute blood loss anemia, Hgb 11.7 this morning. Hemodynamically stable  PLAN: Weightbearing: NWB LUE ROM: Okay for unrestricted shoulder/elbow motion as tolerated Incisional and dressing care: Reinforce dressings as needed.  Plan to remove dressing 03/16/2022 Showering: Okay to begin getting incisions wet 03/17/2022 Orthopedic device(s): Sling as needed for comfort   Pain management:  1. Tylenol 1000 mg q 6 hours scheduled 2. Flexeril 10 mg TID PRN 3. Oxycodone 5-10 mg q 4 hours PRN  4. Dilaudid 0.5-1 mg q 4 hours PRN 5. Lyrica 25 mg in AM, 50 mg QHS VTE prophylaxis:  No chemical ppx needed , SCDs ID: Vancomycin and Ceftriaxone  Foley/Lines:  No foley, KVO IVFs Impediments to Fracture Healing: Vit D level 29, started on D3 supplementation Dispo: PT/OT evaluation today.  Continue to monitor intraoperative cultures.  Would like 48 hours of negative cultures prior to discharge.  If cultures are positive, we will plan to consult ID for assistance with discharge antibiotics.  Plan to remove dressing LUE tomorrow 03/16/2022   D/C recommendations: -Oxycodone for pain control -No DVT prophylaxis needed and is ambulatory patient -Continue 1000 units Vit D supplementation daily x90 days  Follow - up plan: 2 weeks after discharge for wound check and repeat x-rays   Contact information:  Katha Hamming MD, Rushie Nyhan PA-C. After hours and holidays please check [email or web address] for group call information for [facility]   Gwinda Passe, PA-C [phone] (office) [email or web address]

## 2022-03-16 ENCOUNTER — Other Ambulatory Visit (HOSPITAL_COMMUNITY): Payer: Self-pay

## 2022-03-16 DIAGNOSIS — S42302K Unspecified fracture of shaft of humerus, left arm, subsequent encounter for fracture with nonunion: Secondary | ICD-10-CM | POA: Diagnosis not present

## 2022-03-16 LAB — CBC
HCT: 33.7 % — ABNORMAL LOW (ref 39.0–52.0)
Hemoglobin: 11.6 g/dL — ABNORMAL LOW (ref 13.0–17.0)
MCH: 31.4 pg (ref 26.0–34.0)
MCHC: 34.4 g/dL (ref 30.0–36.0)
MCV: 91.3 fL (ref 80.0–100.0)
Platelets: 233 10*3/uL (ref 150–400)
RBC: 3.69 MIL/uL — ABNORMAL LOW (ref 4.22–5.81)
RDW: 12.6 % (ref 11.5–15.5)
WBC: 10.5 10*3/uL (ref 4.0–10.5)
nRBC: 0 % (ref 0.0–0.2)

## 2022-03-16 MED ORDER — VITAMIN D3 25 MCG PO TABS
1000.0000 [IU] | ORAL_TABLET | Freq: Every day | ORAL | 0 refills | Status: AC
  Filled 2022-03-16: qty 90, 90d supply, fill #0

## 2022-03-16 MED ORDER — OXYCODONE HCL 10 MG PO TABS: 5.0000 mg | ORAL_TABLET | ORAL | 0 refills | Status: DC | PRN

## 2022-03-16 MED ORDER — DOXYCYCLINE HYCLATE 100 MG PO TABS
100.0000 mg | ORAL_TABLET | Freq: Two times a day (BID) | ORAL | 0 refills | Status: DC
  Filled 2022-03-16: qty 28, 14d supply, fill #0

## 2022-03-16 MED ORDER — OXYCODONE HCL 10 MG PO TABS
5.0000 mg | ORAL_TABLET | ORAL | 0 refills | Status: DC | PRN
  Filled 2022-03-16: qty 42, 7d supply, fill #0

## 2022-03-16 MED ORDER — DOXYCYCLINE MONOHYDRATE 100 MG PO TABS: 100.0000 mg | ORAL_TABLET | Freq: Two times a day (BID) | ORAL | 0 refills | Status: DC

## 2022-03-16 MED ORDER — VITAMIN D3 25 MCG PO TABS: 1000.0000 [IU] | ORAL_TABLET | Freq: Every day | ORAL | 0 refills | Status: DC

## 2022-03-16 NOTE — Plan of Care (Signed)
°  Problem: Health Behavior/Discharge Planning: °Goal: Ability to manage health-related needs will improve °Outcome: Progressing °  °Problem: Clinical Measurements: °Goal: Ability to maintain clinical measurements within normal limits will improve °Outcome: Progressing °  °Problem: Nutrition: °Goal: Adequate nutrition will be maintained °Outcome: Progressing °  °Problem: Pain Managment: °Goal: General experience of comfort will improve °Outcome: Progressing °  °Problem: Safety: °Goal: Ability to remain free from injury will improve °Outcome: Progressing °  °Problem: Skin Integrity: °Goal: Risk for impaired skin integrity will decrease °Outcome: Progressing °  °

## 2022-03-16 NOTE — Discharge Instructions (Signed)
Orthopaedic Trauma Service Discharge Instructions   General Discharge Instructions  WEIGHT BEARING STATUS:Non-weightbearing left upper extremity  RANGE OF MOTION/ACTIVITY: Ok for shoulder and elbow range of motion as tolerated. Use sling as needed for comfort  Wound Care: You may remove your surgical dressing on Saturday 03/17/22. Incisions can be left open to air if there is no drainage. If incision continues to have drainage, follow wound care instructions below. Once the incision is completely dry and without drainage, it may be left open to air out.  You may begin showering 03/17/22. Allow soap and water to run over incisions but do not scrub them. You may clean gently around the incisions with soap and water.  DVT/PE prophylaxis: None  Diet: as you were eating previously.  Can use over the counter stool softeners and bowel preparations, such as Miralax, to help with bowel movements.  Narcotics can be constipating.  Be sure to drink plenty of fluids  PAIN MEDICATION USE AND EXPECTATIONS  You have likely been given narcotic medications to help control your pain.  After a traumatic event that results in an fracture (broken bone) with or without surgery, it is ok to use narcotic pain medications to help control one's pain.  We understand that everyone responds to pain differently and each individual patient will be evaluated on a regular basis for the continued need for narcotic medications. Ideally, narcotic medication use should last no more than 6-8 weeks (coinciding with fracture healing).   As a patient it is your responsibility as well to monitor narcotic medication use and report the amount and frequency you use these medications when you come to your office visit.   We would also advise that if you are using narcotic medications, you should take a dose prior to therapy to maximize you participation.  IF YOU ARE ON NARCOTIC MEDICATIONS IT IS NOT PERMISSIBLE TO OPERATE A MOTOR VEHICLE  (MOTORCYCLE/CAR/TRUCK/MOPED) OR HEAVY MACHINERY DO NOT MIX NARCOTICS WITH OTHER CNS (CENTRAL NERVOUS SYSTEM) DEPRESSANTS SUCH AS ALCOHOL   STOP SMOKING OR USING NICOTINE PRODUCTS!!!!  As discussed nicotine severely impairs your body's ability to heal surgical and traumatic wounds but also impairs bone healing.  Wounds and bone heal by forming microscopic blood vessels (angiogenesis) and nicotine is a vasoconstrictor (essentially, shrinks blood vessels).  Therefore, if vasoconstriction occurs to these microscopic blood vessels they essentially disappear and are unable to deliver necessary nutrients to the healing tissue.  This is one modifiable factor that you can do to dramatically increase your chances of healing your injury.    (This means no smoking, no nicotine gum, patches, etc)  DO NOT USE NONSTEROIDAL ANTI-INFLAMMATORY DRUGS (NSAID'S)  Using products such as Advil (ibuprofen), Aleve (naproxen), Motrin (ibuprofen) for additional pain control during fracture healing can delay and/or prevent the healing response.  If you would like to take over the counter (OTC) medication, Tylenol (acetaminophen) is ok.  However, some narcotic medications that are given for pain control contain acetaminophen as well. Therefore, you should not exceed more than 4000 mg of tylenol in a day if you do not have liver disease.  Also note that there are may OTC medicines, such as cold medicines and allergy medicines that my contain tylenol as well.  If you have any questions about medications and/or interactions please ask your doctor/PA or your pharmacist.      ICE AND ELEVATE INJURED/OPERATIVE EXTREMITY  Using ice and elevating the injured extremity above your heart can help with swelling and pain  control.  Icing in a pulsatile fashion, such as 20 minutes on and 20 minutes off, can be followed.    Do not place ice directly on skin. Make sure there is a barrier between to skin and the ice pack.    Using frozen items  such as frozen peas works well as the conform nicely to the are that needs to be iced.  USE AN ACE WRAP OR TED HOSE FOR SWELLING CONTROL  In addition to icing and elevation, Ace wraps or TED hose are used to help limit and resolve swelling.  It is recommended to use Ace wraps or TED hose until you are informed to stop.    When using Ace Wraps start the wrapping distally (farthest away from the body) and wrap proximally (closer to the body)   Example: If you had surgery on your leg or thing and you do not have a splint on, start the ace wrap at the toes and work your way up to the thigh        If you had surgery on your upper extremity and do not have a splint on, start the ace wrap at your fingers and work your way up to the upper arm   CALL THE OFFICE FOR MEDICATION REFILL OR WITH ANY QUESTIONS/CONCERNS: [phone]   VISIT OUR WEBSITE FOR ADDITIONAL INFORMATION: [email or web address]    Discharge Wound Care Instructions  Do NOT apply any ointments, solutions or lotions to pin sites or surgical wounds.  These prevent needed drainage and even though solutions like hydrogen peroxide kill bacteria, they also damage cells lining the pin sites that help fight infection.  Applying lotions or ointments can keep the wounds moist and can cause them to breakdown and open up as well. This can increase the risk for infection. When in doubt call the office.   If any drainage is noted, use one layer of adaptic or Mepitel, then gauze, Kerlix, and an ace wrap. - These dressing supplies should be available at local medical supply stores [facility], [facility], etc) as well as Management consultant ([facility], [facility], [place], etc)

## 2022-03-16 NOTE — Progress Notes (Addendum)
Orthopaedic Trauma Progress Note  SUBJECTIVE: Doing well this morning.  Pain controlled. had a poor reaction to IV Dilaudid last night.  Notes that after giving the medication he became itchy, sweaty, and was unable to sleep.  After given some Benadryl the itching resolved and he was able to rest.  Tolerating diet and fluids.  Is not had a BM yet since surgery.  4/5 intraoperative cultures with no growth x48 hours.  One of the soft tissue specimens with WBCs present.  I spoke with the microbiologist this morning who said there was coagulase-negative Staph seen but no ID yet.    OBJECTIVE:  Vitals:   03/16/22 0906 03/16/22 1234  BP: 115/82 107/61  Pulse: 78 100  Resp: 18 18  Temp: 98.8 F (37.1 C) 99.6 F (37.6 C)  SpO2: 93% 95%    General: Sitting up in bedside chair, no acute distress Respiratory: No increased work of breathing.  Left upper extremity: Dressing changed, incision clean, dry, intact with Dermabond in place.  New Mepilex applied. No significant tenderness with palpation over the shoulder.  Mildly tender throughout the humerus as expected.  Tolerates very gentle elbow range of motion.  Wrist flexion extension is intact.  Able to wiggle each of his fingers.  Able to make a full fist.  Motor and sensory function is intact through the median, ulnar, radial nerve distribution.  Has some decreased sensation with light touch over the shoulder but endorses sensation throughout remainder of extremity.  Compartment soft and compressible.  Hand warm and well-perfused.  IMAGING: Stable post op imaging.   LABS:  Results for orders placed or performed during the hospital encounter of 03/14/22 (from the past 24 hour(s))  Glucose, capillary     Status: None   Collection Time: 03/15/22  5:02 PM  Result Value Ref Range   Glucose-Capillary 76 70 - 99 mg/dL  CBC     Status: Abnormal   Collection Time: 03/16/22 12:48 AM  Result Value Ref Range   WBC 10.5 4.0 - 10.5 K/uL   RBC 3.69 (L) 4.22 -  5.81 MIL/uL   Hemoglobin 11.6 (L) 13.0 - 17.0 g/dL   HCT 33.7 (L) 39.0 - 52.0 %   MCV 91.3 80.0 - 100.0 fL   MCH 31.4 26.0 - 34.0 pg   MCHC 34.4 30.0 - 36.0 g/dL   RDW 12.6 11.5 - 15.5 %   Platelets 233 150 - 400 K/uL   nRBC 0.0 0.0 - 0.2 %    ASSESSMENT: Donovan Persley is a 51 y.o. male, 2 Days Post-Op s/p REPAIR OF HUMERAL NONUNION  CV/Blood loss: Acute blood loss anemia, Hgb 11.6 this morning.  Stable.  Hemodynamically stable  PLAN: Weightbearing: NWB LUE ROM: Okay for unrestricted shoulder/elbow motion as tolerated Incisional and dressing care: Dressings changed today.  Continue to change as needed.  Okay to leave open to air if no drainage. Showering: Okay to begin getting incisions wet 03/17/2022 Orthopedic device(s): Sling as needed for comfort  Pain management:  1. Tylenol 1000 mg q 6 hours scheduled 2. Flexeril 10 mg TID PRN 3. Oxycodone 5-10 mg q 4 hours PRN 4. Dilaudid 0.5-1 mg q 4 hours PRN 5. Lyrica 25 mg in AM, 50 mg QHS VTE prophylaxis:  No chemical ppx needed , SCDs ID: Vancomycin and Ceftriaxone  Foley/Lines:  No foley, KVO IVFs Impediments to Fracture Healing: Vit D level 29, started on D3 supplementation Dispo: Therapies as tolerated.  No follow-up PT/OT recommended at discharge.  Have spoken with infectious disease regarding the 1 specimen that showed Staphylococcus.  We will plan to discharge patient home later today on doxycycline x14 days.  I will continue to follow cultures for total 5 days  D/C recommendations: -Oxycodone for pain control -No DVT prophylaxis needed and is ambulatory patient -Continue 1000 units Vit D supplementation daily x90 days  Follow - up plan: 2 weeks after discharge for wound check and repeat x-rays   Contact information:  Katha Hamming MD, Rushie Nyhan PA-C. After hours and holidays please check [email or web address] for group call information for [facility]   Gwinda Passe, PA-C [phone]  (office) [email or web address]

## 2022-03-16 NOTE — Progress Notes (Signed)
Nursing Discharge Note  Admit Date:03/14/2022'@7'$ ;42  Discharge date:03/16/2022    Patient to be discharged home per MD order.  AVS completed. Reviewed with patient and family at bedside. Highlighted copy provided for patient to take home.  Patient/caregiver able to verbalize understanding of discharge instructions. PIV removed. Patient stable upon discharge.       Allergies as of 03/16/2022       Reactions   Dilaudid [hydromorphone Hcl] Itching   Nsaids Other (See Comments)   non union    Percocet [oxycodone-acetaminophen] Other (See Comments)   Pt states he stays awake for a very long period of time. No problems with the 5/325 only can take 1 a day   Tetanus Toxoids Swelling   Shellfish-derived Products Rash   Clams and Oysters        Medication List     STOP taking these medications    HYDROcodone-acetaminophen 5-325 MG tablet Commonly known as: NORCO/VICODIN   ondansetron 4 MG tablet Commonly known as: Zofran       TAKE these medications    acetaminophen 650 MG CR tablet Commonly known as: TYLENOL Take 1,300 mg by mouth 3 (three) times daily.   atorvastatin 20 MG tablet Commonly known as: LIPITOR Take 20 mg by mouth at bedtime.   busPIRone 10 MG tablet Commonly known as: BUSPAR Take 10 mg by mouth daily.   CALCIUM-VITAMIN D PO Take 1 tablet by mouth daily.   cyclobenzaprine 10 MG tablet Commonly known as: FLEXERIL Take 1 tablet (10 mg total) by mouth 3 (three) times daily as needed for muscle spasms.   docusate sodium 100 MG capsule Commonly known as: COLACE Take 1 capsule (100 mg total) by mouth 2 (two) times daily. What changed:  when to take this reasons to take this   doxycycline 100 MG tablet Commonly known as: VIBRA-TABS Take 1 tablet (100 mg total) by mouth 2 (two) times daily for 14 days.   escitalopram 20 MG tablet Commonly known as: LEXAPRO Take 20 mg by mouth in the morning.   fluticasone 50 MCG/ACT nasal spray Commonly known as:  FLONASE Place 1 spray into both nostrils at bedtime.   multivitamin with minerals tablet Take 1 tablet by mouth daily.   Oxycodone HCl 10 MG Tabs Take 0.5-1 tablets (5-10 mg total) by mouth every 4 (four) hours as needed (5 mg moderate pain, 10 mg severe pain).   pregabalin 25 MG capsule Commonly known as: LYRICA Take 25-50 mg by mouth See admin instructions. Take 25 mg in the Morning and 50 mg at bedtime   vitamin D3 25 MCG tablet Commonly known as: CHOLECALCIFEROL Take 1 tablet (1,000 Units total) by mouth daily. Start taking on: March 17, 2022         Discharge Instructions/ Education: Discharge instructions given to patient/wife with verbalized understanding. Discharge education completed with patient/family including: follow up instructions, medication list, discharge activities, and limitations if indicated. Additional discharge instructions as indicated by discharging provider also reviewed. Patient and family able to verbalize understanding, all questions fully answered. Patient instructed to return to Emergency Department, call 911, or call MD for any changes in condition.  Patient escorted via wheelchair to lobby and discharged home via private automobile driven by wife.

## 2022-03-16 NOTE — Plan of Care (Signed)
  Problem: Health Behavior/Discharge Planning: Goal: Ability to manage health-related needs will improve Outcome: Progressing   Problem: Clinical Measurements: Goal: Respiratory complications will improve Outcome: Progressing Goal: Cardiovascular complication will be avoided Outcome: Progressing   Problem: Activity: Goal: Risk for activity intolerance will decrease Outcome: Progressing   Problem: Nutrition: Goal: Adequate nutrition will be maintained Outcome: Progressing   Problem: Coping: Goal: Level of anxiety will decrease Outcome: Progressing   Problem: Skin Integrity: Goal: Risk for impaired skin integrity will decrease Outcome: Progressing   Problem: Safety: Goal: Ability to remain free from injury will improve Outcome: Progressing

## 2022-03-16 NOTE — Plan of Care (Signed)
  Problem: Health Behavior/Discharge Planning: Goal: Ability to manage health-related needs will improve 03/16/2022 1259 by Gerline Legacy, LPN Outcome: Adequate for Discharge 03/16/2022 0754 by Gerline Legacy, LPN Outcome: Progressing   Problem: Clinical Measurements: Goal: Ability to maintain clinical measurements within normal limits will improve Outcome: Adequate for Discharge Goal: Will remain free from infection Outcome: Adequate for Discharge Goal: Diagnostic test results will improve Outcome: Adequate for Discharge Goal: Respiratory complications will improve 03/16/2022 1259 by Gerline Legacy, LPN Outcome: Adequate for Discharge 03/16/2022 0754 by Gerline Legacy, LPN Outcome: Progressing Goal: Cardiovascular complication will be avoided 03/16/2022 1259 by Gerline Legacy, LPN Outcome: Adequate for Discharge 03/16/2022 0754 by Gerline Legacy, LPN Outcome: Progressing

## 2022-03-16 NOTE — Progress Notes (Signed)
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 Original L humerous in 12/22 with conservervative management but then had an ORIF and a bone graft completed in 2/23 and had a fall in 5/23 landing on the LUE. Pt s/p on 03/13/22 repair of humeral nonunion and removal of hardware on 9/6. PMH: anxiety, GERD, hepatitis, panic attack, prostate ca   OT comments  Pt very motivated to go home and progressing towards established OT goals. Providing gentle ROM exercises for hand, wrist, elbow, and shoulder. During PROM of shoulder, noting increased blood from bandage. Notified RN. PA coming and changing/reinforcing bandage. Reviewing education on compensatory techniques for ADLs with wife and pt. Providing handout with exercises and compensatory techniques. Recommend dc to home with follow up at OP per MD. Answering all questions in preparation for dc.    Recommendations for follow up therapy are one component of a multi-disciplinary discharge planning process, led by the attending physician.  Recommendations may be updated based on patient status, additional functional criteria and insurance authorization.    Follow Up Recommendations  Outpatient OT    Assistance Recommended at Discharge    Patient can return home with the following      Equipment Recommendations  None recommended by OT    Recommendations for Other Services      Precautions / Restrictions Precautions Precautions: Fall Required Braces or Orthoses: Sling Restrictions Weight Bearing Restrictions: Yes LUE Weight Bearing: Non weight bearing Other Position/Activity Restrictions: unrestricted shoulder/elbow motion as tolerated       Mobility Bed Mobility               General bed mobility comments: Sitting on couch    Transfers Overall  transfer level: Independent                       Balance Overall balance assessment: Independent                                         ADL either performed or assessed with clinical judgement   ADL Overall ADL's : Needs assistance/impaired                   Upper Body Dressing Details (indicate cue type and reason): Discussed donning/doffing shirt techniques                 Functional mobility during ADLs: Independent General ADL Comments: Reviewing compensatory techniques for UB ADLs, LB ADLs, and ROM    Extremity/Trunk Assessment Upper Extremity Assessment Upper Extremity Assessment: LUE deficits/detail LUE Deficits / Details: Able to perform AROM of hand and wrist. unable to perform AROM of elbow. Very limited shoulder PROM LUE: Unable to fully assess due to pain LUE Sensation: decreased light touch LUE Coordination: decreased gross motor   Lower Extremity Assessment Lower Extremity Assessment: Defer to PT evaluation        Vision       Perception     Praxis      Cognition Arousal/Alertness: Awake/alert Behavior During Therapy: WFL for tasks assessed/performed Overall Cognitive Status: Within Functional Limits for tasks assessed  Exercises Exercises: General Upper Extremity, Hand exercises General Exercises - Upper Extremity Shoulder Flexion: Left, 10 reps, Standing, Limitations, PROM Shoulder Flexion Limitations: Gentle ROM - limited to ~ 50* Shoulder ABduction: PROM, Left, 10 reps, Seated Elbow Flexion: PROM, Left, 10 reps, Seated Elbow Extension: PROM, Left, 10 reps, Seated Wrist Flexion: Left, 10 reps, Seated, AROM Wrist Extension: Left, 10 reps, Seated, AROM Digit Composite Flexion: Left, 10 reps, Seated, AROM Composite Extension: Left, 10 reps, Seated, AROM Hand Exercises Forearm Supination: Left, 10 reps, Seated, AROM Forearm Pronation: Left, 10 reps,  Seated, AROM Opposition: AROM, Left, 10 reps, Seated    Shoulder Instructions       General Comments During shoulder PROM, noting blood from sx site. Notified RN. PA arriving and changing bandage.    Pertinent Vitals/ Pain       Pain Assessment Pain Assessment: Faces Faces Pain Scale: Hurts even more Pain Location: L shoulder Pain Descriptors / Indicators: Aching Pain Intervention(s): Monitored during session, Limited activity within patient's tolerance, Repositioned  Home Living Family/patient expects to be discharged to:: Private residence                                        Prior Functioning/Environment              Frequency  Min 3X/week        Progress Toward Goals  OT Goals(current goals can now be found in the care plan section)  Progress towards OT goals: Progressing toward goals  Acute Rehab OT Goals OT Goal Formulation: With patient Time For Goal Achievement: 03/29/22 Potential to Achieve Goals: Good ADL Goals Pt Will Perform Eating: with modified independence;sitting Pt Will Perform Grooming: with modified independence;sitting Pt Will Perform Upper Body Bathing: with min guard assist;sitting Pt Will Perform Lower Body Bathing: with supervision;sit to/from stand Pt Will Transfer to Toilet: with modified independence;ambulating  Plan Discharge plan remains appropriate    Co-evaluation                 AM-PAC OT "6 Clicks" Daily Activity     Outcome Measure   Help from another person eating meals?: A Little Help from another person taking care of personal grooming?: A Little Help from another person toileting, which includes using toliet, bedpan, or urinal?: A Little Help from another person bathing (including washing, rinsing, drying)?: A Lot Help from another person to put on and taking off regular upper body clothing?: A Lot Help from another person to put on and taking off regular lower body clothing?: A Lot 6 Click  Score: 15    End of Session    OT Visit Diagnosis: Unsteadiness on feet (R26.81);Other abnormalities of gait and mobility (R26.89);Repeated falls (R29.6);Muscle weakness (generalized) (M62.81);History of falling (Z91.81);Pain Pain - Right/Left: Left Pain - part of body: Shoulder   Activity Tolerance Patient tolerated treatment well   Patient Left in chair;with call bell/phone within reach   Nurse Communication Mobility status        Time: 2119-4174 OT Time Calculation (min): 35 min  Charges: OT General Charges $OT Visit: 1 Visit OT Treatments $Self Care/Home Management : 8-22 mins $Therapeutic Exercise: 8-22 mins  Glady Ouderkirk MSOT, OTR/L Acute Rehab Office: [place] 03/16/2022, 2:56 PM

## 2022-03-19 LAB — AEROBIC/ANAEROBIC CULTURE W GRAM STAIN (SURGICAL/DEEP WOUND)
Culture: NO GROWTH
Culture: NO GROWTH
Culture: NO GROWTH
Culture: NO GROWTH
Gram Stain: NONE SEEN
Gram Stain: NONE SEEN
Gram Stain: NONE SEEN

## 2022-03-19 NOTE — Discharge Summary (Signed)
Orthopaedic Trauma Service (OTS) Discharge Summary   Patient ID: Timothy [name] MRN: [id number] DOB/AGE: 53/29/1972 53 y.o.  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    Panic attack    Prostate cancer [facility])      Procedures Performed:  CPT 09983-JASNKN of left humeral shaft nonunion CPT 20680-Removal of left humerus hardware  Discharged Condition: stable  Hospital Course: Patient presented to [facility] on 03/14/2022 for scheduled procedure on left upper extremity.  Was taken to the operating room by Dr. Doreatha Martin for the above procedure, tolerated this well without complications.  Several fluids/soft tissue samples were obtained intraoperatively and sent for culture.  Patient was admitted to the orthopedic service for pain control and observation.  Patient was placed on broad-spectrum IV antibiotics while we awaited culture results. He began working with physical and occupational therapy starting on POD #1. At 48 hours post-op, 1/5 intraoperative cultures showed WBCs present, but no organism had been identified at that time. The decision was made to discharge patient home with a broad spectrum oral antibiotic. Just prior to discharge, patient was working with OT and while performing range of motion of the left upper extremity, he felt a suture "pop". There was bloody drainage form the center of the incision but no clear area of wound dehiscence. The incision was reinforced with Dermabond and a compressive dressing was applied to the upper arm.  On 03/16/2022, the patient was tolerating diet, working well with therapies, pain well controlled, vital signs stable,  dressings intact and felt stable for discharge to home. Patient will follow up as below and knows to call with questions or concerns.     Consults: None  Significant Diagnostic Studies:  Results for orders placed or performed during the hospital encounter of 03/14/22 (from the past 168 hour(s))  Comprehensive metabolic panel per protocol   Collection Time: 03/14/22  7:20 AM  Result Value Ref Range   Sodium 140 135 - 145 mmol/L   Potassium 4.1 3.5 - 5.1 mmol/L   Chloride 108 98 - 111 mmol/L   CO2 23 22 - 32 mmol/L   Glucose, Bld 98 70 - 99 mg/dL   BUN 15 6 - 20 mg/dL   Creatinine, Ser 0.63 0.61 - 1.24 mg/dL   Calcium 8.9 8.9 - 10.3 mg/dL   Total Protein 6.6 6.5 - 8.1 g/dL   Albumin 3.4 (L) 3.5 - 5.0 g/dL   AST 26 15 - 41 U/L   ALT 24 0 - 44 U/L   Alkaline Phosphatase 93 38 - 126 U/L   Total Bilirubin 0.7 0.3 - 1.2 mg/dL   GFR, Estimated >60 >60 mL/min   Anion gap 9 5 - 15  CBC per protocol   Collection Time: 03/14/22  7:20 AM  Result Value Ref Range   WBC 7.4 4.0 - 10.5 K/uL   RBC 4.81 4.22 - 5.81 MIL/uL   Hemoglobin 14.9 13.0 - 17.0 g/dL   HCT 44.1 39.0 - 52.0 %   MCV 91.7 80.0 - 100.0 fL   MCH 31.0 26.0 - 34.0 pg   MCHC 33.8 30.0 - 36.0 g/dL   RDW 12.7 11.5 - 15.5 %  Platelets 268 150 - 400 K/uL   nRBC 0.0 0.0 - 0.2 %  Aerobic/Anaerobic Culture w Gram Stain (surgical/deep wound)   Collection Time: 03/14/22  9:15 AM   Specimen: Other Source; Body Fluid  Result Value Ref Range   Specimen Description OTHER    Special Requests LEFT HUMERAL 1    Gram Stain NO WBC SEEN NO ORGANISMS SEEN     Culture      NO GROWTH 4 DAYS NO ANAEROBES ISOLATED; CULTURE IN PROGRESS FOR 5 DAYS Performed at [facility] [address] [place], [place] [address]    Report Status PENDING   Aerobic/Anaerobic Culture w Gram Stain (surgical/deep wound)   Collection Time: 03/14/22  9:18 AM   Specimen: Other Source; Body Fluid  Result Value Ref Range   Specimen Description OTHER     Special Requests  LEFT HUMERAL 2    Gram Stain      ABUNDANT WBC PRESENT,BOTH PMN AND MONONUCLEAR NO ORGANISMS SEEN    Culture      NO GROWTH 4 DAYS NO ANAEROBES ISOLATED; CULTURE IN PROGRESS FOR 5 DAYS Performed at [facility] [address], [place], [place] [address]    Report Status PENDING   Aerobic/Anaerobic Culture w Gram Stain (surgical/deep wound)   Collection Time: 03/14/22  9:20 AM   Specimen: Other Source; Tissue  Result Value Ref Range   Specimen Description OTHER    Special Requests  LEFT HUMERAL 3    Gram Stain      FEW WBC PRESENT,BOTH PMN AND MONONUCLEAR NO ORGANISMS SEEN    Culture      RARE STAPHYLOCOCCUS EPIDERMIDIS NO ANAEROBES ISOLATED; CULTURE IN PROGRESS FOR 5 DAYS    Report Status PENDING    Organism ID, Bacteria STAPHYLOCOCCUS EPIDERMIDIS       Susceptibility   Staphylococcus epidermidis - MIC*    CIPROFLOXACIN <=0.5 SENSITIVE Sensitive     ERYTHROMYCIN <=0.25 SENSITIVE Sensitive     GENTAMICIN <=0.5 SENSITIVE Sensitive     OXACILLIN <=0.25 SENSITIVE Sensitive     TETRACYCLINE <=1 SENSITIVE Sensitive     VANCOMYCIN 1 SENSITIVE Sensitive     TRIMETH/SULFA <=10 SENSITIVE Sensitive     CLINDAMYCIN <=0.25 SENSITIVE Sensitive     RIFAMPIN <=0.5 SENSITIVE Sensitive     Inducible Clindamycin Value in next row Sensitive      NEGATIVEPerformed at [facility] [address] [place], [place] [address]    * RARE STAPHYLOCOCCUS EPIDERMIDIS  Aerobic/Anaerobic Culture w Gram Stain (surgical/deep wound)   Collection Time: 03/14/22  9:20 AM   Specimen: Other Source; Tissue  Result Value Ref Range   Specimen Description OTHER    Special Requests  LEFT HUMERAL 4    Gram Stain NO ORGANISMS SEEN NO WBC SEEN     Culture      NO GROWTH 4 DAYS NO ANAEROBES ISOLATED; CULTURE IN PROGRESS FOR 5 DAYS Performed at [facility], [place] [address], [place], [place] [address]    Report Status PENDING   Aerobic/Anaerobic Culture w Gram Stain  (surgical/deep wound)   Collection Time: 03/14/22  9:20 AM   Specimen: Other Source; Tissue  Result Value Ref Range   Specimen Description OTHER    Special Requests  LEFT HUMERAL 5    Gram Stain NO ORGANISMS SEEN NO WBC SEEN     Culture      NO GROWTH 4 DAYS NO ANAEROBES ISOLATED; CULTURE IN PROGRESS FOR 5 DAYS Performed  at [facility], [place] [address] [place], [place] [address]    Report Status PENDING   Glucose, capillary   Collection Time: 03/14/22  8:08 PM  Result Value Ref Range   Glucose-Capillary 124 (H) 70 - 99 mg/dL  CBC   Collection Time: 03/15/22  1:05 AM  Result Value Ref Range   WBC 9.4 4.0 - 10.5 K/uL   RBC 3.82 (L) 4.22 - 5.81 MIL/uL   Hemoglobin 11.7 (L) 13.0 - 17.0 g/dL   HCT 35.0 (L) 39.0 - 52.0 %   MCV 91.6 80.0 - 100.0 fL   MCH 30.6 26.0 - 34.0 pg   MCHC 33.4 30.0 - 36.0 g/dL   RDW 12.7 11.5 - 15.5 %   Platelets 206 150 - 400 K/uL   nRBC 0.0 0.0 - 0.2 %  Basic metabolic panel   Collection Time: 03/15/22  1:05 AM  Result Value Ref Range   Sodium 136 135 - 145 mmol/L   Potassium 4.1 3.5 - 5.1 mmol/L   Chloride 104 98 - 111 mmol/L   CO2 23 22 - 32 mmol/L   Glucose, Bld 109 (H) 70 - 99 mg/dL   BUN 12 6 - 20 mg/dL   Creatinine, Ser 0.69 0.61 - 1.24 mg/dL   Calcium 8.3 (L) 8.9 - 10.3 mg/dL   GFR, Estimated >60 >60 mL/min   Anion gap 9 5 - 15  Hemoglobin A1c   Collection Time: 03/15/22  1:05 AM  Result Value Ref Range   Hgb A1c MFr Bld 5.5 4.8 - 5.6 %   Mean Plasma Glucose 111.15 mg/dL  VITAMIN D 25 Hydroxy (Vit-D Deficiency, Fractures)   Collection Time: 03/15/22  1:05 AM  Result Value Ref Range   Vit D, 25-Hydroxy 29.96 (L) 30 - 100 ng/mL  Glucose, capillary   Collection Time: 03/15/22  5:02 PM  Result Value Ref Range   Glucose-Capillary 76 70 - 99 mg/dL  CBC   Collection Time: 03/16/22 12:48 AM  Result Value Ref Range   WBC 10.5 4.0 - 10.5 K/uL   RBC 3.69 (L) 4.22 - 5.81 MIL/uL   Hemoglobin 11.6 (L) 13.0 - 17.0 g/dL   HCT 33.7 (L)  39.0 - 52.0 %   MCV 91.3 80.0 - 100.0 fL   MCH 31.4 26.0 - 34.0 pg   MCHC 34.4 30.0 - 36.0 g/dL   RDW 12.6 11.5 - 15.5 %   Platelets 233 150 - 400 K/uL   nRBC 0.0 0.0 - 0.2 %     Treatments: IV hydration, antibiotics: vancomycin and ceftriaxone, analgesia: acetaminophen, oxycodone, and Dilaudid, therapies: PT and OT, and surgery: as above  Discharge Exam: General: Sitting up in bedside chair, no acute distress Respiratory: No increased work of breathing.  Left upper extremity: Dressing changed, incision clean, dry, intact with Dermabond in place.  New Mepilex applied. No significant tenderness with palpation over the shoulder.  Mildly tender throughout the humerus as expected.  Tolerates very gentle elbow range of motion.  Wrist flexion extension is intact.  Able to wiggle each of his fingers.  Able to make a full fist.  Motor and sensory function is intact through the median, ulnar, radial nerve distribution.  Has some decreased sensation with light touch over the shoulder but endorses sensation throughout remainder of extremity. Compartment soft and compressible.  Hand warm and well-perfused.    Disposition: Discharge disposition: 01-Home or Self Care        Allergies as of 03/16/2022  Reactions   Dilaudid [hydromorphone Hcl] Itching   Nsaids Other (See Comments)   non union    Percocet [oxycodone-acetaminophen] Other (See Comments)   Pt states he stays awake for a very long period of time. No problems with the 5/325 only can take 1 a day   Tetanus Toxoids Swelling   Shellfish-derived Products Rash   Clams and Oysters        Medication List     STOP taking these medications    HYDROcodone-acetaminophen 5-325 MG tablet Commonly known as: NORCO/VICODIN   ondansetron 4 MG tablet Commonly known as: Zofran       TAKE these medications    acetaminophen 650 MG CR tablet Commonly known as: TYLENOL Take 1,300 mg by mouth 3 (three) times daily.   atorvastatin  20 MG tablet Commonly known as: LIPITOR Take 20 mg by mouth at bedtime.   busPIRone 10 MG tablet Commonly known as: BUSPAR Take 10 mg by mouth daily.   CALCIUM-VITAMIN D PO Take 1 tablet by mouth daily.   cyclobenzaprine 10 MG tablet Commonly known as: FLEXERIL Take 1 tablet (10 mg total) by mouth 3 (three) times daily as needed for muscle spasms.   docusate sodium 100 MG capsule Commonly known as: COLACE Take 1 capsule (100 mg total) by mouth 2 (two) times daily. What changed:  when to take this reasons to take this   doxycycline 100 MG tablet Commonly known as: VIBRA-TABS Take 1 tablet (100 mg total) by mouth 2 (two) times daily for 14 days.   escitalopram 20 MG tablet Commonly known as: LEXAPRO Take 20 mg by mouth in the morning.   fluticasone 50 MCG/ACT nasal spray Commonly known as: FLONASE Place 1 spray into both nostrils at bedtime.   multivitamin with minerals tablet Take 1 tablet by mouth daily.   Oxycodone HCl 10 MG Tabs Take 0.5-1 tablets (5-10 mg total) by mouth every 4 (four) hours as needed (5 mg moderate pain, 10 mg severe pain).   pregabalin 25 MG capsule Commonly known as: LYRICA Take 25-50 mg by mouth See admin instructions. Take 25 mg in the Morning and 50 mg at bedtime   vitamin D3 25 MCG tablet Commonly known as: CHOLECALCIFEROL Take 1 tablet (1,000 Units total) by mouth daily.        Follow-up Information     Haddix, Thomasene Lot, MD. Schedule an appointment as soon as possible for a visit in 2 week(s).   Specialty: Orthopedic Surgery Why: for wound check and repeat x-rays Contact information: [place] [address] [phone]         Daisy Lazar A, PA-C Follow up.   Specialty: Physician Assistant Contact information: [place] [id number] [phone]                 Discharge Instructions and Plan: Patient will be discharged to home. Will be discharged on None  for DVT prophylaxis. Patient has been provided with all the necessary DME for discharge. Patient will follow up with Dr. Doreatha Martin on 03/20/22 at 11:30 AM for wound check.  Signed:  Gwinda Passe, PA-C ?[phone]? (phone) 03/19/2022, 9:19 AM  [facility] [place] [address] [phone] (F)

## 2022-03-22 ENCOUNTER — Other Ambulatory Visit: Payer: Self-pay

## 2022-03-22 ENCOUNTER — Ambulatory Visit: Payer: Self-pay | Admitting: Student

## 2022-03-22 ENCOUNTER — Encounter (HOSPITAL_COMMUNITY): Payer: Self-pay | Admitting: Student

## 2022-03-22 NOTE — Anesthesia Preprocedure Evaluation (Signed)
Anesthesia Evaluation  Patient identified by MRN, date of birth, ID band Patient awake    Reviewed: Allergy & Precautions, NPO status , Patient's Chart, lab work & pertinent test results  History of Anesthesia Complications Negative for: history of anesthetic complications  Airway Mallampati: II  TM Distance: >3 FB Neck ROM: Full    Dental  (+) Dental Advisory Given, Teeth Intact   Pulmonary neg pulmonary ROS, former smoker,    Pulmonary exam normal        Cardiovascular negative cardio ROS Normal cardiovascular exam     Neuro/Psych negative neurological ROS     GI/Hepatic Neg liver ROS, GERD  ,  Endo/Other  negative endocrine ROS  Renal/GU negative Renal ROS   Prostate cancer    Musculoskeletal negative musculoskeletal ROS (+)   Abdominal   Peds  Hematology negative hematology ROS (+)   Anesthesia Other Findings Left arm wound  Reproductive/Obstetrics                           Anesthesia Physical Anesthesia Plan  ASA: 2  Anesthesia Plan: General   Post-op Pain Management: Tylenol PO (pre-op)*   Induction: Intravenous  PONV Risk Score and Plan: 2 and Ondansetron, Dexamethasone, Midazolam and Treatment may vary due to age or medical condition  Airway Management Planned: LMA  Additional Equipment: None  Intra-op Plan:   Post-operative Plan: Extubation in OR  Informed Consent: I have reviewed the patients History and Physical, chart, labs and discussed the procedure including the risks, benefits and alternatives for the proposed anesthesia with the patient or authorized representative who has indicated his/her understanding and acceptance.     Dental advisory given  Plan Discussed with:   Anesthesia Plan Comments:        Anesthesia Quick Evaluation

## 2022-03-22 NOTE — H&P (Signed)
[facility] (OTS) H&P  Patient ID: Timothy [name] MRN: [id number] DOB/AGE: [birth year] 53 y.o.  Reason for surgery: Postoperative seroma left upper extremity  HPI: Timothy [name] is an 51 y.o. male presenting for surgery on left upper extremity.  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  Patient tolerated the procedure well.  On day of discharge (03/16/2022) patient was working with therapy when he felt one of his sutures "popped".  He immediately had drainage from this area.  A pressure dressing was applied to the left upper extremity.  Over the weekend, the drainage from the incision decreased.  Patient seen in [place] [facility] on 03/20/2022.  At this time, the drainage was minimal and serosanguineous in nature. Over the last 3 days, patient has had significant increase in bloody/serosanguineous drainage from his left upper extremity incision.  He is having to change the dressing at least twice a day, noting that at each dressing change the ABD pad and Ace wrap are saturated.  Patient presents now for irrigation and debridement of his left upper extremity.   Past Medical History:  Diagnosis Date   Anxiety    GERD (gastroesophageal reflux disease)    Hepatitis    age 17.    Panic attack    Prostate cancer [facility])     Past Surgical History:  Procedure Laterality Date   FRACTURE SURGERY Right 2012   rods, screws   HUMERUS IM NAIL Left 03/14/2022   Procedure: REPAIR OF HUMERAL NONUNION;  Surgeon: Shona Needles, MD;  Location: [place];  Service: Orthopedics;  Laterality: Left;   LEG SURGERY     LYMPHADENECTOMY Bilateral 10/24/2020   Procedure: LYMPHADENECTOMY, PELVIC;  Surgeon: Janith Lima, MD;  Location: [facility];  Service: Urology;  Laterality: Bilateral;   ORIF HUMERUS FRACTURE Left 09/2021   ORIF HUMERUS FRACTURE Left 11/23/2021   Procedure: Revision open reduction internal fixation  left humeral shaft fracture with allograft bone graft;  Surgeon: Justice Britain, MD;  Location: [facility];  Service: Orthopedics;  Laterality: Left;  163mn   PROSTATE BIOPSY     ROBOT ASSISTED LAPAROSCOPIC RADICAL PROSTATECTOMY N/A 10/24/2020   Procedure: XI ROBOTIC ASSISTED LAPAROSCOPIC RADICAL PROSTATECTOMY;  Surgeon: GJanith Lima MD;  Location: [facility];  Service: Urology;  Laterality: N/A;  NEEDS 240 MIN   WISDOM TOOTH EXTRACTION      Family History  Adopted: Yes  Problem Relation Age of Onset   Breast cancer Neg Hx    Colon cancer Neg Hx    Pancreatic cancer Neg Hx    Prostate cancer Neg Hx     Social History:  reports that he has quit smoking. His smoking use included cigarettes. He has a 31.50 pack-year smoking history. He has never used smokeless tobacco. He reports current alcohol use. He reports that he does not use drugs.  Allergies:  Allergies  Allergen Reactions   Dilaudid [Hydromorphone Hcl] Itching   Nsaids Other (See Comments)    non union    Percocet [Oxycodone-Acetaminophen] Other (See Comments)    Pt states he stays awake for a very long period of time. No problems with the 5/325 only can take 1 a day   Tetanus Toxoids Swelling   Shellfish-Derived Products Rash    Clams and Oysters    Medications: I have reviewed the patient's current medications. Prior to Admission:  No medications prior to admission.    ROS: Constitutional: No fever or chills  Vision: No changes in vision ENT: No difficulty swallowing CV: No chest pain Pulm: No SOB or wheezing GI: No nausea or vomiting GU: No urgency or inability to hold urine Skin: + LUE wound drainage Neurologic: No numbness or tingling Psychiatric: No depression or anxiety Heme: No bruising Allergic: No reaction to medications or food   Exam: There were no vitals taken for this visit. General: No acute distress Orientation: Alert and oriented x4 Mood and Affect: Mood and affect appropriate, pleasant and  cooperative Gait: Within normal limits Coordination and balance: Within normal limits  Left upper extremity: Surgical incision with 2 small punctate areas with active serosanguineous drainage.  Remainder of the incision appears stable with Dermabond in place.  No surrounding erythema.  No fluctuance.  No expressible purulence.  Tolerates gentle shoulder and elbow range of motion.  Endorses sensation throughout extremity.  Neurovascularly intact.  Right upper extremity: Skin without lesions. No tenderness to palpation. Full painless ROM, full strength in each muscle group without evidence of instability.   Medical Decision Making: Data: Imaging: AP and lateral views of the left humerus show intramedullary nail in place with humeral fracture in anatomic alignment  Labs:  Results for orders placed or performed during the hospital encounter of 03/14/22 (from the past 168 hour(s))  CBC   Collection Time: 03/16/22 12:48 AM  Result Value Ref Range   WBC 10.5 4.0 - 10.5 K/uL   RBC 3.69 (L) 4.22 - 5.81 MIL/uL   Hemoglobin 11.6 (L) 13.0 - 17.0 g/dL   HCT 33.7 (L) 39.0 - 52.0 %   MCV 91.3 80.0 - 100.0 fL   MCH 31.4 26.0 - 34.0 pg   MCHC 34.4 30.0 - 36.0 g/dL   RDW 12.6 11.5 - 15.5 %   Platelets 233 150 - 400 K/uL   nRBC 0.0 0.0 - 0.2 %     Assessment/Plan: 51 year old male s/p HW removal and IMN left humerus fracture nonunion 03/14/22  Patient has had continued drainage from his incision over the last week, this is consistent with a postoperative seroma.  Despite regular and frequent dressing changes, the drainage has not slowed or stopped.  Of note, patient had similar issue to this in May 2023 after undergoing ORIF of the left humerus.  At that time, he had continued drainage from his incision which ultimately required an ED visit. Due to high risk of infection, I recommend proceeding with irrigation and debridement of the left arm.  Risk and benefits of procedure have been discussed with the  patient.  He agrees to proceed with surgery.  Plan discharge patient home postoperatively.  Gwinda Passe PA-C [facility] [phone] (office) [email or web address]

## 2022-03-23 ENCOUNTER — Encounter (HOSPITAL_COMMUNITY): Payer: Self-pay | Admitting: Student

## 2022-03-23 ENCOUNTER — Ambulatory Visit (HOSPITAL_COMMUNITY): Payer: BC Managed Care – PPO | Admitting: Anesthesiology

## 2022-03-23 ENCOUNTER — Ambulatory Visit (HOSPITAL_COMMUNITY)
Admission: RE | Admit: 2022-03-23 | Discharge: 2022-03-23 | Disposition: A | Discharge status: Home / Self Care | Payer: BC Managed Care – PPO | Attending: Student | Admitting: Student

## 2022-03-23 ENCOUNTER — Encounter (HOSPITAL_COMMUNITY)
Admission: RE | Disposition: A | Discharge status: Home / Self Care | Payer: Self-pay | Source: Home / Self Care | Attending: Student

## 2022-03-23 ENCOUNTER — Other Ambulatory Visit: Payer: Self-pay

## 2022-03-23 DIAGNOSIS — K219 Gastro-esophageal reflux disease without esophagitis: Secondary | ICD-10-CM | POA: Diagnosis not present

## 2022-03-23 DIAGNOSIS — Z87891 Personal history of nicotine dependence: Secondary | ICD-10-CM | POA: Insufficient documentation

## 2022-03-23 DIAGNOSIS — L7634 Postprocedural seroma of skin and subcutaneous tissue following other procedure: Secondary | ICD-10-CM | POA: Insufficient documentation

## 2022-03-23 HISTORY — PX: I & D EXTREMITY: SHX5045

## 2022-03-23 SURGERY — IRRIGATION AND DEBRIDEMENT EXTREMITY
Anesthesia: General | Laterality: Left

## 2022-03-23 MED ORDER — VANCOMYCIN HCL 1000 MG IV SOLR
INTRAVENOUS | Status: DC | PRN
  Administered 2022-03-23: 1000 mg via TOPICAL

## 2022-03-23 MED ORDER — VANCOMYCIN HCL 1000 MG IV SOLR
INTRAVENOUS | Status: AC
  Filled 2022-03-23: qty 20

## 2022-03-23 MED ORDER — ORAL CARE MOUTH RINSE: 15.0000 mL | Freq: Once | OROMUCOSAL | Status: AC

## 2022-03-23 MED ORDER — TOBRAMYCIN SULFATE 1.2 G IJ SOLR
INTRAMUSCULAR | Status: AC
  Filled 2022-03-23: qty 1.2

## 2022-03-23 MED ORDER — TOBRAMYCIN SULFATE 1.2 G IJ SOLR
INTRAMUSCULAR | Status: DC | PRN
  Administered 2022-03-23: 1.2 g via TOPICAL

## 2022-03-23 MED ORDER — PROPOFOL 10 MG/ML IV BOLUS
INTRAVENOUS | Status: DC | PRN
  Administered 2022-03-23: 200 mg via INTRAVENOUS

## 2022-03-23 MED ORDER — OXYCODONE HCL 5 MG/5ML PO SOLN: 5.0000 mg | Freq: Once | ORAL | Status: AC | PRN

## 2022-03-23 MED ORDER — DEXAMETHASONE SODIUM PHOSPHATE 10 MG/ML IJ SOLN
INTRAMUSCULAR | Status: DC | PRN
  Administered 2022-03-23: 5 mg via INTRAVENOUS

## 2022-03-23 MED ORDER — LACTATED RINGERS IV SOLN: INTRAVENOUS | Status: DC

## 2022-03-23 MED ORDER — FENTANYL CITRATE (PF) 250 MCG/5ML IJ SOLN
INTRAMUSCULAR | Status: DC | PRN
  Administered 2022-03-23 (×2): 25 ug via INTRAVENOUS
  Administered 2022-03-23: 100 ug via INTRAVENOUS

## 2022-03-23 MED ORDER — FENTANYL CITRATE (PF) 250 MCG/5ML IJ SOLN
INTRAMUSCULAR | Status: AC
  Filled 2022-03-23: qty 5

## 2022-03-23 MED ORDER — EPHEDRINE SULFATE-NACL 50-0.9 MG/10ML-% IV SOSY
PREFILLED_SYRINGE | INTRAVENOUS | Status: DC | PRN
  Administered 2022-03-23 (×5): 5 mg via INTRAVENOUS

## 2022-03-23 MED ORDER — CHLORHEXIDINE GLUCONATE 0.12 % MT SOLN
15.0000 mL | Freq: Once | OROMUCOSAL | Status: AC
  Administered 2022-03-23: 15 mL via OROMUCOSAL
  Filled 2022-03-23: qty 15

## 2022-03-23 MED ORDER — ACETAMINOPHEN 500 MG PO TABS
1000.0000 mg | ORAL_TABLET | Freq: Once | ORAL | Status: AC
  Administered 2022-03-23: 1000 mg via ORAL
  Filled 2022-03-23: qty 2

## 2022-03-23 MED ORDER — OXYCODONE HCL 5 MG PO TABS
5.0000 mg | ORAL_TABLET | Freq: Once | ORAL | Status: AC | PRN
  Administered 2022-03-23: 5 mg via ORAL

## 2022-03-23 MED ORDER — FENTANYL CITRATE (PF) 100 MCG/2ML IJ SOLN
INTRAMUSCULAR | Status: AC
  Filled 2022-03-23: qty 2

## 2022-03-23 MED ORDER — LIDOCAINE 2% (20 MG/ML) 5 ML SYRINGE
INTRAMUSCULAR | Status: DC | PRN
  Administered 2022-03-23: 60 mg via INTRAVENOUS

## 2022-03-23 MED ORDER — CEFADROXIL 500 MG PO CAPS: 1000.0000 mg | ORAL_CAPSULE | Freq: Two times a day (BID) | ORAL | Status: DC

## 2022-03-23 MED ORDER — SODIUM CHLORIDE 0.9 % IR SOLN
Status: DC | PRN
  Administered 2022-03-23: 3000 mL

## 2022-03-23 MED ORDER — CEFAZOLIN SODIUM-DEXTROSE 2-4 GM/100ML-% IV SOLN
2.0000 g | INTRAVENOUS | Status: AC
  Administered 2022-03-23: 2 g via INTRAVENOUS
  Filled 2022-03-23: qty 100

## 2022-03-23 MED ORDER — PROPOFOL 10 MG/ML IV BOLUS
INTRAVENOUS | Status: AC
  Filled 2022-03-23: qty 20

## 2022-03-23 MED ORDER — MIDAZOLAM HCL 2 MG/2ML IJ SOLN
INTRAMUSCULAR | Status: AC
  Filled 2022-03-23: qty 2

## 2022-03-23 MED ORDER — ONDANSETRON HCL 4 MG/2ML IJ SOLN
INTRAMUSCULAR | Status: DC | PRN
  Administered 2022-03-23: 4 mg via INTRAVENOUS

## 2022-03-23 MED ORDER — OXYCODONE HCL 5 MG PO TABS
ORAL_TABLET | ORAL | Status: AC
  Filled 2022-03-23: qty 1

## 2022-03-23 MED ORDER — FENTANYL CITRATE (PF) 100 MCG/2ML IJ SOLN
25.0000 ug | INTRAMUSCULAR | Status: DC | PRN
  Administered 2022-03-23 (×3): 50 ug via INTRAVENOUS

## 2022-03-23 MED ORDER — ONDANSETRON HCL 4 MG/2ML IJ SOLN: 4.0000 mg | Freq: Once | INTRAMUSCULAR | Status: DC | PRN

## 2022-03-23 MED ORDER — AMISULPRIDE (ANTIEMETIC) 5 MG/2ML IV SOLN: 10.0000 mg | Freq: Once | INTRAVENOUS | Status: DC | PRN

## 2022-03-23 MED ORDER — MIDAZOLAM HCL 2 MG/2ML IJ SOLN
INTRAMUSCULAR | Status: DC | PRN
  Administered 2022-03-23: 2 mg via INTRAVENOUS

## 2022-03-23 MED ORDER — BACITRACIN ZINC 500 UNIT/GM EX OINT
TOPICAL_OINTMENT | CUTANEOUS | Status: AC
  Filled 2022-03-23: qty 28.35

## 2022-03-23 SURGICAL SUPPLY — 50 items
BAG COUNTER SPONGE SURGICOUNT (BAG) ×1 IMPLANT
BNDG COHESIVE 4X5 TAN STRL (GAUZE/BANDAGES/DRESSINGS) ×1 IMPLANT
BNDG ELASTIC 4X5.8 VLCR STR LF (GAUZE/BANDAGES/DRESSINGS) IMPLANT
BNDG GAUZE DERMACEA FLUFF 4 (GAUZE/BANDAGES/DRESSINGS) ×2 IMPLANT
BRUSH SCRUB EZ PLAIN DRY (MISCELLANEOUS) ×2 IMPLANT
CHLORAPREP W/TINT 26 (MISCELLANEOUS) ×1 IMPLANT
COVER MAYO STAND STRL (DRAPES) ×1 IMPLANT
COVER SURGICAL LIGHT HANDLE (MISCELLANEOUS) ×2 IMPLANT
DRAPE ORTHO SPLIT 77X108 STRL (DRAPES) ×1
DRAPE SURG 17X23 STRL (DRAPES) ×1 IMPLANT
DRAPE SURG ORHT 6 SPLT 77X108 (DRAPES) ×1 IMPLANT
DRAPE U-SHAPE 47X51 STRL (DRAPES) ×1 IMPLANT
DRSG ADAPTIC 3X8 NADH LF (GAUZE/BANDAGES/DRESSINGS) ×1 IMPLANT
DRSG MEPITEL 4X7.2 (GAUZE/BANDAGES/DRESSINGS) IMPLANT
ELECT REM PT RETURN 9FT ADLT (ELECTROSURGICAL)
ELECTRODE REM PT RTRN 9FT ADLT (ELECTROSURGICAL) IMPLANT
EVACUATOR 1/8 PVC DRAIN (DRAIN) IMPLANT
GAUZE PAD ABD 8X10 STRL (GAUZE/BANDAGES/DRESSINGS) IMPLANT
GAUZE SPONGE 4X4 12PLY STRL (GAUZE/BANDAGES/DRESSINGS) ×1 IMPLANT
GLOVE BIO SURGEON STRL SZ 6.5 (GLOVE) ×3 IMPLANT
GLOVE BIO SURGEON STRL SZ7.5 (GLOVE) ×4 IMPLANT
GLOVE BIOGEL PI IND STRL 6.5 (GLOVE) ×1 IMPLANT
GLOVE BIOGEL PI IND STRL 7.5 (GLOVE) ×1 IMPLANT
GOWN STRL REUS W/ TWL LRG LVL3 (GOWN DISPOSABLE) ×2 IMPLANT
GOWN STRL REUS W/TWL LRG LVL3 (GOWN DISPOSABLE) ×2
HANDPIECE INTERPULSE COAX TIP (DISPOSABLE)
KIT BASIN OR (CUSTOM PROCEDURE TRAY) ×1 IMPLANT
KIT TURNOVER KIT B (KITS) ×1 IMPLANT
MANIFOLD NEPTUNE II (INSTRUMENTS) ×1 IMPLANT
NS IRRIG 1000ML POUR BTL (IV SOLUTION) ×1 IMPLANT
PACK ORTHO EXTREMITY (CUSTOM PROCEDURE TRAY) ×1 IMPLANT
PAD ARMBOARD 7.5X6 YLW CONV (MISCELLANEOUS) ×2 IMPLANT
PADDING CAST COTTON 6X4 STRL (CAST SUPPLIES) ×1 IMPLANT
SET HNDPC FAN SPRY TIP SCT (DISPOSABLE) IMPLANT
SPONGE T-LAP 18X18 ~~LOC~~+RFID (SPONGE) ×1 IMPLANT
SUT ETHILON 2 0 FS 18 (SUTURE) ×2 IMPLANT
SUT ETHILON 3 0 PS 1 (SUTURE) ×2 IMPLANT
SUT MON AB 2-0 CT1 36 (SUTURE) ×1 IMPLANT
SUT PDS AB 0 CT 36 (SUTURE) IMPLANT
SUT VIC AB 0 CT1 27 (SUTURE) ×2
SUT VIC AB 0 CT1 27XBRD ANBCTR (SUTURE) IMPLANT
SUT VIC AB 2-0 CT1 27 (SUTURE) ×1
SUT VIC AB 2-0 CT1 TAPERPNT 27 (SUTURE) IMPLANT
SWAB CULTURE ESWAB REG 1ML (MISCELLANEOUS) IMPLANT
TOWEL GREEN STERILE (TOWEL DISPOSABLE) ×2 IMPLANT
TOWEL GREEN STERILE FF (TOWEL DISPOSABLE) ×1 IMPLANT
TUBE CONNECTING 12X1/4 (SUCTIONS) ×1 IMPLANT
UNDERPAD 30X36 HEAVY ABSORB (UNDERPADS AND DIAPERS) ×1 IMPLANT
WATER STERILE IRR 1000ML POUR (IV SOLUTION) ×1 IMPLANT
YANKAUER SUCT BULB TIP NO VENT (SUCTIONS) ×1 IMPLANT

## 2022-03-23 NOTE — Discharge Instructions (Addendum)
Orthopaedic Trauma Service Discharge Instructions   General Discharge Instructions  WEIGHT BEARING STATUS:Non-weightbearing left upper extremity  RANGE OF MOTION/ACTIVITY: ok for gentle shoulder and elbow motion as tolerated  Wound Care: Maintain dressing until follow-up in clinic on Tuesday 03/27/22. Reinforce dressing as needed  DVT/PE prophylaxis: None  Diet: as you were eating previously.  Can use over the counter stool softeners and bowel preparations, such as Miralax, to help with bowel movements.  Narcotics can be constipating.  Be sure to drink plenty of fluids  PAIN MEDICATION USE AND EXPECTATIONS  You have likely been given narcotic medications to help control your pain.  After a traumatic event that results in an fracture (broken bone) with or without surgery, it is ok to use narcotic pain medications to help control one's pain.  We understand that everyone responds to pain differently and each individual patient will be evaluated on a regular basis for the continued need for narcotic medications. Ideally, narcotic medication use should last no more than 6-8 weeks (coinciding with fracture healing).   As a patient it is your responsibility as well to monitor narcotic medication use and report the amount and frequency you use these medications when you come to your office visit.   We would also advise that if you are using narcotic medications, you should take a dose prior to therapy to maximize you participation.  IF YOU ARE ON NARCOTIC MEDICATIONS IT IS NOT PERMISSIBLE TO OPERATE A MOTOR VEHICLE (MOTORCYCLE/CAR/TRUCK/MOPED) OR HEAVY MACHINERY DO NOT MIX NARCOTICS WITH OTHER CNS (CENTRAL NERVOUS SYSTEM) DEPRESSANTS SUCH AS ALCOHOL   STOP SMOKING OR USING NICOTINE PRODUCTS!!!!  As discussed nicotine severely impairs your body's ability to heal surgical and traumatic wounds but also impairs bone healing.  Wounds and bone heal by forming microscopic blood vessels (angiogenesis)  and nicotine is a vasoconstrictor (essentially, shrinks blood vessels).  Therefore, if vasoconstriction occurs to these microscopic blood vessels they essentially disappear and are unable to deliver necessary nutrients to the healing tissue.  This is one modifiable factor that you can do to dramatically increase your chances of healing your injury.    (This means no smoking, no nicotine gum, patches, etc)  DO NOT USE NONSTEROIDAL ANTI-INFLAMMATORY DRUGS (NSAID'S)  Using products such as Advil (ibuprofen), Aleve (naproxen), Motrin (ibuprofen) for additional pain control during fracture healing can delay and/or prevent the healing response.  If you would like to take over the counter (OTC) medication, Tylenol (acetaminophen) is ok.  However, some narcotic medications that are given for pain control contain acetaminophen as well. Therefore, you should not exceed more than 4000 mg of tylenol in a day if you do not have liver disease.  Also note that there are may OTC medicines, such as cold medicines and allergy medicines that my contain tylenol as well.  If you have any questions about medications and/or interactions please ask your doctor/PA or your pharmacist.      ICE AND ELEVATE INJURED/OPERATIVE EXTREMITY  Using ice and elevating the injured extremity above your heart can help with swelling and pain control.  Icing in a pulsatile fashion, such as 20 minutes on and 20 minutes off, can be followed.    Do not place ice directly on skin. Make sure there is a barrier between to skin and the ice pack.    Using frozen items such as frozen peas works well as the conform nicely to the are that needs to be iced.  USE AN ACE WRAP OR TED  HOSE FOR SWELLING CONTROL  In addition to icing and elevation, Ace wraps or TED hose are used to help limit and resolve swelling.  It is recommended to use Ace wraps or TED hose until you are informed to stop.    When using Ace Wraps start the wrapping distally (farthest away  from the body) and wrap proximally (closer to the body)   Example: If you had surgery on your leg or thing and you do not have a splint on, start the ace wrap at the toes and work your way up to the thigh        If you had surgery on your upper extremity and do not have a splint on, start the ace wrap at your fingers and work your way up to the upper arm   CALL THE OFFICE FOR MEDICATION REFILL OR WITH ANY QUESTIONS/CONCERNS: [phone]   VISIT OUR WEBSITE FOR ADDITIONAL INFORMATION: [email or web address]     Discharge Wound Care Instructions  Do NOT apply any ointments, solutions or lotions to pin sites or surgical wounds.  These prevent needed drainage and even though solutions like hydrogen peroxide kill bacteria, they also damage cells lining the pin sites that help fight infection.  Applying lotions or ointments can keep the wounds moist and can cause them to breakdown and open up as well. This can increase the risk for infection. When in doubt call the office.  Surgical incisions should be dressed daily.  If any drainage is noted, use one layer of adaptic or Mepitel, then gauze, Kerlix, and an ace wrap. - These dressing supplies should be available at local medical supply stores [facility], [facility], etc) as well as Management consultant ([employer], [employer], [employer], etc)  Once the incision is completely dry and without drainage, it may be left open to air out.  Showering may begin 36-48 hours later.  Cleaning gently with soap and water.  Traumatic wounds should be dressed daily as well.    One layer of adaptic, gauze, Kerlix, then ace wrap.  The adaptic can be discontinued once the draining has ceased    If you have a wet to dry dressing: wet the gauze with saline the squeeze as much saline out so the gauze is moist (not soaking wet), place moistened gauze over wound, then place a dry gauze over the moist one, followed by Kerlix wrap, then ace wrap.

## 2022-03-23 NOTE — Op Note (Signed)
Orthopaedic Surgery Operative Note (CSN: [id number] ) Date of Surgery: 03/23/2022  Admit Date: 03/23/2022   Diagnoses: Pre-Op Diagnoses: Left humerus nonunion Postoperative wound drainage  Post-Op Diagnosis: Same  Procedures: CPT 10180-Irrigation and debridement of draining wound  Surgeons : Primary: Shona Needles, MD  Assistant: Patrecia Pace, PA-C  Location: OR 3   Anesthesia:General  Antibiotics: Ancef 2g preop with 1 gm vancomycin powder and 1.2gm tobramycin powder   Tourniquet time:None   Estimated Blood VHQI:69 mL  Complications:None   Specimens:None   Implants: * No implants in log *   Indications for Surgery: 51 year old male who underwent nonunion repair for his left humerus on 03/14/2022.  He subsequently was working with therapy when he had a feeling that one of the sutures popped he has had persistent drainage since that time.  We have tried compressive dressings as well as multiple dressing changes with no success.  As a result I felt that he was indicated for irrigation debridement with revision closure.  Risk and benefits were discussed with patient.  Risks include but not limited to persistent drainage, infection, need for further surgery, and the possible anesthetic complications.  He agreed to proceed with surgery and consent was obtained.  Operative Findings: Irrigation and debridement of left humeral wound with primary closure.  No active infection identified.  Procedure: The patient was identified in the preoperative holding area. Consent was confirmed with the patient and their family and all questions were answered. The operative extremity was marked after confirmation with the patient. he was then brought back to the operating room by our anesthesia colleagues.  He was carefully transferred over to a regular OR table.  He was placed under general anesthetic.  The left upper extremity was then prepped and draped in usual sterile fashion.  Timeout was  performed to verify the patient, the procedure, and the extremity.  Preoperative antibiotics were dosed.  I reopened the incision and removed the suture that was in place.  The drainage went all the way down to the nonunion site.  I used a Cobb elevator to debride the wound edges and debride the soft tissue with the wound cavity.  I tried not to disrupt any of the healing at the nonunion site.  I then placed a gram of vancomycin powder and 1.2 g of tobramycin powder after irrigating with 3 L of normal saline.  I then performed a layered closure of 0 Vicryl for the deep fascial layers and 2-0 Monocryl and 3-0 nylon for the skin.  Sterile dressing was applied.  The patient was then awoke from anesthesia and taken to the PACU in stable condition.  Post Op Plan/Instructions: Patient be nonweightbearing to the left upper extremity.  He will continue with the oral antibiotics single positive culture at last surgery.  Continue to wear the dressing until he follows up next week in clinic.  We will get new x-rays at that time.  I was present and performed the entire surgery.  Patrecia Pace, PA-C did assist me throughout the case. An assistant was necessary given the difficulty in approach, maintenance of reduction and ability to instrument the fracture.   Katha Hamming, MD [facility]

## 2022-03-23 NOTE — Interval H&P Note (Signed)
History and Physical Interval Note:  03/23/2022 7:17 AM  Timothy [name]  has presented today for surgery, with the diagnosis of Left arm draining wound.  The various methods of treatment have been discussed with the patient and family. After consideration of risks, benefits and other options for treatment, the patient has consented to  Procedure(s): IRRIGATION AND DEBRIDEMENT LEFT ARM (Left) as a surgical intervention.  The patient's history has been reviewed, patient examined, no change in status, stable for surgery.  I have reviewed the patient's chart and labs.  Questions were answered to the patient's satisfaction.     Lennette Bihari P Sanaa Zilberman

## 2022-03-23 NOTE — Transfer of Care (Signed)
Immediate Anesthesia Transfer of Care Note  Patient: Timothy [name]  Procedure(s) Performed: IRRIGATION AND DEBRIDEMENT LEFT ARM (Left)  Patient Location: PACU  Anesthesia Type:General  Level of Consciousness: awake, alert  and oriented  Airway & Oxygen Therapy: Patient Spontanous Breathing and Patient connected to nasal cannula oxygen  Post-op Assessment: Report given to RN and Post -op Vital signs reviewed and stable  Post vital signs: Reviewed and stable  Last Vitals:  Vitals Value Taken Time  BP    Temp    Pulse    Resp    SpO2      Last Pain:  Vitals:   03/23/22 0623  TempSrc:   PainSc: 0-No pain         Complications: No notable events documented.

## 2022-03-23 NOTE — Anesthesia Postprocedure Evaluation (Signed)
Anesthesia Post Note  Patient: Timothy [name]  Procedure(s) Performed: IRRIGATION AND DEBRIDEMENT LEFT ARM (Left)     Patient location during evaluation: PACU Anesthesia Type: General Level of consciousness: awake and alert Pain management: pain level controlled Vital Signs Assessment: post-procedure vital signs reviewed and stable Respiratory status: spontaneous breathing, nonlabored ventilation and respiratory function stable Cardiovascular status: blood pressure returned to baseline and stable Postop Assessment: no apparent nausea or vomiting Anesthetic complications: no   No notable events documented.  Last Vitals:  Vitals:   03/23/22 0900 03/23/22 0915  BP: 131/86 120/74  Pulse: 82 74  Resp: 16 12  Temp:  36.6 C  SpO2: 96% 95%    Last Pain:  Vitals:   03/23/22 0915  TempSrc:   PainSc: 2                  Lidia Collum

## 2022-03-23 NOTE — Anesthesia Procedure Notes (Signed)
Procedure Name: LMA Insertion Date/Time: 03/23/2022 7:35 AM  Performed by: Mariea Clonts, CRNAPre-anesthesia Checklist: Patient identified, Emergency Drugs available, Suction available and Patient being monitored Patient Re-evaluated:Patient Re-evaluated prior to induction Oxygen Delivery Method: Circle System Utilized Preoxygenation: Pre-oxygenation with 100% oxygen Induction Type: IV induction Ventilation: Mask ventilation without difficulty LMA: LMA inserted LMA Size: 4.0 Number of attempts: 1 Airway Equipment and Method: Bite block Placement Confirmation: positive ETCO2 Tube secured with: Tape Dental Injury: Teeth and Oropharynx as per pre-operative assessment

## 2022-03-24 ENCOUNTER — Encounter (HOSPITAL_COMMUNITY): Payer: Self-pay | Admitting: Student

## 2022-04-25 ENCOUNTER — Other Ambulatory Visit (HOSPITAL_COMMUNITY): Payer: Self-pay

## 2022-11-28 ENCOUNTER — Other Ambulatory Visit (HOSPITAL_COMMUNITY): Payer: Self-pay | Admitting: Urology

## 2022-11-28 DIAGNOSIS — C61 Malignant neoplasm of prostate: Secondary | ICD-10-CM

## 2022-11-28 DIAGNOSIS — R9721 Rising PSA following treatment for malignant neoplasm of prostate: Secondary | ICD-10-CM

## 2022-12-05 NOTE — Progress Notes (Signed)
Radiation Oncology         [phone] ________________________________  Outpatient Re-Consultation  Name: Timothy [name] MRN: [id number]  Date of Service: 12/06/2022 DOB: 24-Dec-1970  CC:Timothy Lions, PA-C  Timothy Hick, MD   REFERRING PHYSICIAN: [name], MD  DIAGNOSIS: 52 y/o man with biochemically recurrent prostate cancer s/p RALP in 10/2020 for pT3bN0, Gleason 4+5 adenocarcinoma of the prostate with pretreatment PSA of 5.1 and current post-operative PSA of 0.19.    ICD-10-CM   1. Malignant neoplasm of prostate (HCC)  C61       HISTORY OF PRESENT ILLNESS: Timothy [name] is a 52 y.o. male seen at the request of [name].  We initially met with this patient on 09/02/2020 for consult to discuss treatment options for cTic, 4+4 adenocarcinoma of the prostate in 7/12 cores on prostate biopsy performed 07/28/2020.  His PSA was 5.1 at the time of diagnosis.  Imaging for disease staging was negative for any evidence of disease outside of the prostate or metastasis.  He ultimately elected to proceed with RALP and BPLND for treatment of his disease and final pathology confirmed pT3b,N0 Gleason 4+5 prostate cancer with intraductal and cribriform glands identified.  There was seminal vesicle and lymphovascular involvement with extraprostatic extension but margins were negative and all sampled lymph nodes were benign.  His initial postoperative PSA on 12/13/2020 was 0.026.  A follow-up PSA in October 2022 was 0.14 but had come back down to 0.018 when repeated in November 2022.  A PSMA PET scan for disease restaging in November 2022 was negative.  His PSA has continued to gradually rise since that time at 0.039 in April 2023, 0.11 in October 2023, 0.14 in February 2024 and 0.19 on most recent labs from 11/15/2022.  He is scheduled for a repeat PSMA PET scan for disease restaging on 12/17/2022.  He has reviewed the pathology and lab results with his urologist and has been kindly  referred back to [place] today to discuss the potential role of salvage radiotherapy.  PREVIOUS RADIATION THERAPY: No  PAST MEDICAL HISTORY:  Past Medical History:  Diagnosis Date   Anxiety    GERD (gastroesophageal reflux disease)    Hepatitis    age 1. Hepatitis   Panic attack    Prostate cancer [facility])       PAST SURGICAL HISTORY: Past Surgical History:  Procedure Laterality Date   FRACTURE SURGERY Right 2012   rods, screws   HUMERUS IM NAIL Left 03/14/2022   Procedure: REPAIR OF HUMERAL NONUNION;  Surgeon: [name], MD;  Location: [facility] OR;  Service: Orthopedics;  Laterality: Left;   I & D EXTREMITY Left 03/23/2022   Procedure: IRRIGATION AND DEBRIDEMENT LEFT ARM;  Surgeon: [name], MD;  Location: [facility] OR;  Service: Orthopedics;  Laterality: Left;   LEG SURGERY     LYMPHADENECTOMY Bilateral 10/24/2020   Procedure: LYMPHADENECTOMY, PELVIC;  Surgeon: Timothy Hick, MD;  Location: [facility];  Service: Urology;  Laterality: Bilateral;   ORIF HUMERUS FRACTURE Left 09/2021   ORIF HUMERUS FRACTURE Left 11/23/2021   Procedure: Revision open reduction internal fixation left humeral shaft fracture with allograft bone graft;  Surgeon: [name], MD;  Location: [facility];  Service: Orthopedics;  Laterality: Left;    PROSTATE BIOPSY     ROBOT ASSISTED LAPAROSCOPIC RADICAL PROSTATECTOMY N/A 10/24/2020   Procedure: XI ROBOTIC ASSISTED LAPAROSCOPIC RADICAL PROSTATECTOMY;  Surgeon: Timothy Hick, MD;  Location: [facility];  Service: Urology;  Laterality: N/A;  NEEDS 240 MIN   WISDOM TOOTH EXTRACTION      FAMILY HISTORY:  Family History  Adopted: Yes  Problem Relation Age of Onset   Breast cancer Neg Hx    Colon cancer Neg Hx    Pancreatic cancer Neg Hx    Prostate cancer Neg Hx     SOCIAL HISTORY:  Social History   Socioeconomic History   Marital status: Married    Spouse name: Not on file   Number of children: Not on file   Years of education: Not on file   Highest  education level: Not on file  Occupational History   Not on file  Tobacco Use   Smoking status: Former    Packs/day: 1.50    Years: 21.00    Additional pack years: 0.00    Total pack years: 31.50    Types: Cigarettes    Quit date: 2021    Years since quitting: 3.4   Smokeless tobacco: Never  Vaping Use   Vaping Use: Former  Substance and Sexual Activity   Alcohol use: Yes    Alcohol/week: 18.0 standard drinks of alcohol    Types: 18 Cans of beer per week    Comment: 2-3 beers daily   Drug use: Never   Sexual activity: Yes  Other Topics Concern   Not on file  Social History Narrative   Not on file   Social Determinants of Health   Financial Resource Strain: Not on file  Food Insecurity: Not on file  Transportation Needs: Not on file  Physical Activity: Not on file  Stress: Not on file  Social Connections: Not on file  Intimate Partner Violence: Not on file    ALLERGIES: Dilaudid [hydromorphone hcl], Nsaids, Percocet [oxycodone-acetaminophen], Tetanus toxoids, and Shellfish-derived products  MEDICATIONS:  Current Outpatient Medications  Medication Sig Dispense Refill   acetaminophen (TYLENOL) 650 MG CR tablet Take 1,300 mg by mouth 3 (three) times daily.     atorvastatin (LIPITOR) 20 MG tablet Take 20 mg by mouth at bedtime.     busPIRone (BUSPAR) 10 MG tablet Take 10 mg by mouth daily.     CALCIUM-VITAMIN D PO Take 1 tablet by mouth daily.     cefadroxil (DURICEF) 500 MG capsule Take 2 capsules (1,000 mg total) by mouth 2 (two) times daily. 180 capsule    cyclobenzaprine (FLEXERIL) 10 MG tablet Take 1 tablet (10 mg total) by mouth 3 (three) times daily as needed for muscle spasms. (Patient not taking: Reported on 03/09/2022) 30 tablet 1   docusate sodium (COLACE) 100 MG capsule Take 1 capsule (100 mg total) by mouth 2 (two) times daily. (Patient taking differently: Take 100 mg by mouth daily as needed for mild constipation or moderate constipation.)     escitalopram  (LEXAPRO) 20 MG tablet Take 20 mg by mouth in the morning.     fluticasone (FLONASE) 50 MCG/ACT nasal spray Place 1 spray into both nostrils at bedtime.     Multiple Vitamins-Minerals (MULTIVITAMIN WITH MINERALS) tablet Take 1 tablet by mouth daily.     Oxycodone HCl 10 MG TABS Take 0.5-1 tablets (5-10 mg total) by mouth every 4 (four) hours as needed (5 mg moderate pain, 10 mg severe pain). 42 tablet 0   pregabalin (LYRICA) 25 MG capsule Take 25-50 mg by mouth See admin instructions. Take 25 mg in the Morning and 50 mg at bedtime     No current facility-administered medications for this visit.  REVIEW OF SYSTEMS: On review of systems, the patient reports that he is doing well overall. He denies any chest pain, shortness of breath, cough, fevers, chills, night sweats, unintended weight changes. He denies any bowel disturbances, and denies abdominal pain, nausea or vomiting. He denies any new musculoskeletal or joint aches or pains. His IPSS was 7, indicating mild urinary symptoms. With some hesitancy  He specifically denies gross hematuria, dysuria, frequency, urgency, nocturia or incontinence.  His SHIM was 1, indicating he has severe post-operative erectile dysfunction. A complete review of systems is obtained and is otherwise negative.  PHYSICAL EXAM:  Wt Readings from Last 3 Encounters:  03/23/22 214 lb (97.1 kg)  03/14/22 241 lb (109.3 kg)  11/23/21 210 lb (95.3 kg)   Temp Readings from Last 3 Encounters:  03/23/22 97.9 F (36.6 C)  03/16/22 99.6 F (37.6 C) (Oral)  11/29/21 98 F (36.7 C) (Oral)   BP Readings from Last 3 Encounters:  03/23/22 120/74  03/16/22 107/61  11/29/21 112/81   Pulse Readings from Last 3 Encounters:  03/23/22 74  03/16/22 100  11/29/21 89    /10  In general this is a well appearing Caucasian male in no acute distress. He's alert and oriented x4 and appropriate throughout the examination. Cardiopulmonary assessment is negative for acute distress and  he exhibits normal effort.   KPS = 100  100 - Normal; no complaints; no evidence of disease. 90   - Able to carry on normal activity; minor signs or symptoms of disease. 80   - Normal activity with effort; some signs or symptoms of disease. 60   - Cares for self; unable to carry on normal activity or to do active work. 60   - Requires occasional assistance, but is able to care for most of his personal needs. 50   - Requires considerable assistance and frequent medical care. 40   - Disabled; requires special care and assistance. 30   - Severely disabled; hospital admission is indicated although death not imminent. 20   - Very sick; hospital admission necessary; active supportive treatment necessary. 10   - Moribund; fatal processes progressing rapidly. 0     - Dead  Karnofsky DA, Abelmann WH, Craver LS and Burchenal [facility] [phone]) The use of the nitrogen mustards in the palliative treatment of carcinoma: with particular reference to bronchogenic carcinoma Cancer 1 [phone]  LABORATORY DATA:  Lab Results  Component Value Date   WBC 10.5 03/16/2022   HGB 11.6 (L) 03/16/2022   HCT 33.7 (L) 03/16/2022   MCV 91.3 03/16/2022   PLT 233 03/16/2022   Lab Results  Component Value Date   NA 136 03/15/2022   K 4.1 03/15/2022   CL 104 03/15/2022   CO2 23 03/15/2022   Lab Results  Component Value Date   ALT 24 03/14/2022   AST 26 03/14/2022   ALKPHOS 93 03/14/2022   BILITOT 0.7 03/14/2022     RADIOGRAPHY: No results found.    IMPRESSION/PLAN: 1. 52 y.o. man with biochemically recurrent prostate cancer s/p RALP in 10/2020 for pT3bN0, Gleason 4+5 adenocarcinoma of the prostate with pretreatment PSA of 5.1 and current post-operative PSA of 0.19. Today we reviewed the findings and workup thus far.  We discussed the natural history of prostate cancer.  We reviewed the implications of positive margins, extracapsular extension, and seminal vesicle involvement on the risk of prostate cancer  recurrence. In his case, there was seminal vesicle involvement, lymphovascular involvement and extra-capsular extension present on  his final surgical pathology. We reviewed some of the evidence suggesting an advantage for patients with detectable, rising postoperative PSA, who undergo early salvage therapy which can lead to excellent results in terms of disease control and survival. We discussed radiation treatment directed to the prostatic fossa with regard to the logistics and delivery of a 7.5 week course of daily external beam radiotherapy as well as anticipated acute and long-term side effects associated with this treatment.  He was encouraged to ask questions that were answered to his stated satisfaction.   At the conclusion of our conversation, the patient is interested in moving forward with 7.5 weeks of salvage external beam therapy without ADT pending there are no unexpected findings to suggest metastatic disease on his upcoming PSMA PET scheduled for 12/17/22.  We will share our discussion with [name] and move forward with treatment planning accordingly. The patient appears to have a good understanding of his disease and our treatment recommendations which are of curative intent and is in agreement with the stated plan. He has freely signed written consent to proceed today in the office and a copy of this document will be placed in his medical record. We have tentatively scheduled CT SIM/treatment planning for 2pm on 12/17/22, in anticipation of beginning IMRT in the near future. He understands that our treatment recommendations could potentially change, pending PSMA PET findings so we will watch for those results and will call the patient thereafter to confirm our recommendations. We enjoyed meeting with him and his wife again today and look forward to continuing to participate in his care.     We personally spent 50 minutes in this encounter including chart review, reviewing radiological studies,  meeting face-to-face with the patient, entering orders and completing documentation.    Marguarite Arbour, PA-C    Margaretmary Dys, MD  [facility]  Radiation Oncology Direct Dial: [phone]  Fax: [phone] [email or web address]  Skype  LinkedIn   References:  JAMA. 2006 Nov 15;296(19):2329-35.  Adjuvant radiotherapy for pathologically advanced prostate cancer: a randomized clinical trial.  Janee Morn IM Jr(1), Tangen CM, Raymondo Band MS, Lazarus Salines, Messing Grier [facility].  Author information: (1)Department of Urology, [facility] [place], [id number], [address] [place], [place] [address], [place]. [email or web address]  CONTEXT: Despite a stage-shift to earlier cancer stages and lower tumor volumes for prostate cancer, pathologically advanced disease is detected at radical prostatectomy in 38% to 52% of patients. However, the optimal management of these patients after radical prostatectomy is unknown.  OBJECTIVE: To determine whether adjuvant radiotherapy improves metastasis-free survival in patients with stage pT3 N0 M0 prostate cancer.  DESIGN, SETTING, AND PATIENTS: Randomized, prospective, multi-institutional, [place] clinical trial with enrollment between February 21, 1987, and July 10, 1995 (with database frozen for statistical analysis on March 29, 2004). Patients were 425 men with pathologically advanced prostate cancer who had undergone radical prostatectomy. INTERVENTION: Men were randomly assigned to receive 60 to 64 Gy of external beam radiotherapy delivered to the prostatic fossa (n = 214) or usual care plus observation (n = 211).  MAIN OUTCOME MEASURES: Primary outcome was metastasis-free survival, defined as time to first occurrence of metastatic disease or death due to any cause. Secondary outcomes included prostate-specific antigen (PSA) relapse, recurrence-free survival, overall  survival, freedom from hormonal therapy, and postoperative complications.  RESULTS: Among the 425 men, median follow-up was 10.6 years (interquartile range, 9.2-12.7 years). For  metastasis-free survival, 76 (35.5%) of 214 men in the adjuvant radiotherapy group were diagnosed with metastatic disease or died (median metastasis-free estimate, 14.7 years), compared with 91 (43.1%) of 211 (median metastasis-free estimate, 13.2 years) of those in the observation group (hazard ratio [HR], 0.75; 95% CI, 0.55-1.02; P = .06). There were no significant between-group differences for overall survival (71 deaths, median survival of 14.7 years for radiotherapy vs 83 deaths, median survival of 13.8 years for observation; HR, 0.80; 95% CI, 0.58-1.09; P = .16). PSA relapse (median PSA relapse-free survival, 10.3 years for radiotherapy vs 3.1 years for observation; HR, 0.43; 95% CI, 0.31-0.58; P<.001) and disease recurrence (median recurrence-free survival, 13.8 years for radiotherapy vs 9.9 years for observation; HR, 0.62; 95% CI, 0.46-0.82; P = .001) were both significantly reduced with radiotherapy. Adverse effects were more common with radiotherapy vs observation (23.8% vs 11.9%), including rectal complications (3.3% vs 0%), urethral strictures (17.8% vs 9.5%), and total urinary incontinence (6.5% vs 2.8%).  CONCLUSIONS: In men who had undergone radical prostatectomy for pathologically advanced prostate cancer, adjuvant radiotherapy resulted in significantly reduced risk of PSA relapse and disease recurrence, although the improvements in metastasis-free survival and overall survival were not statistically significant.  Trial Registration clinicaltrials.gov Identifier: [id number]. PMID: [id number]   J Clin Oncol. 2007 Jun 1;25(16):2225-9. Predominant treatment failure in postprostatectomy patients is local: analysis of patterns of treatment failure in SWOG 8794.  Swanson GP(1), Dale MA, Tangen CM, Chin J, Messing E,  Canby-Hagino Severiano Gilbert, [facility] [place] [facility];  Author information: (1)Department of Radiation Oncology and Urology, [facility], [place], [place] [address], [place]. [email or web address] Comment in J Clin [place]. 2007 Dec 10;25(35):5671-2.  PURPOSE: [facility] (SWOG) trial [id number] demonstrated that adjuvant radiation reduces the risk of biochemical (prostate-specific antigen [PSA]) treatment failure by 50% over radical prostatectomy alone. In this analysis, we stratified patients as to their preradiation PSA levels and correlated it with outcomes such as PSA treatment failure, local recurrence, and distant failure, to serve as guidelines for future research.  PATIENTS AND METHODS: Four hundred thirty-one subjects with pathologically advanced prostate cancer (extraprostatic extension, positive surgical margins, or seminal vesicle invasion) were randomly assigned to adjuvant radiotherapy or observation.  RESULTS: Three hundred seventy-four eligible patients had immediate postprostatectomy and follow-up PSA data. Median follow-up was 10.2 years. For patients with a postsurgical PSA of 0.2 ng/mL, radiation was associated with reductions in the 10-year risk of biochemical treatment failure (72% to 42%), local failures (20% to 7%), and distant failures (12% to 4%). For patients with a postsurgical PSA between higher than 0.2 and <or = 1.0 ng/mL, reductions in the 10-year risk of biochemical failure (80% to 73%), local failures (25% to 9%), and distant failures (16% to 12%) were realized. In patients with postsurgical PSA higher than 1.0, the respective findings were 94% versus 100%, 28% versus 9%, and 44% versus 18%.  CONCLUSION: The pattern of treatment failure in high-risk patients is predominantly local with a surprisingly low incidence of metastatic failure. Adjuvant radiation to the prostate bed reduces the risk of metastatic disease and biochemical failure at all  postsurgical PSA levels. Further improvement in reducing local treatment failure is likely to have the greatest impact on outcome in high-risk patients after prostatectomy.  PMID: [id number]     J Urol. 2009 Mar;181(3):956-62.  Adjuvant radiotherapy for pathological T3N0M0 prostate cancer significantly reduces risk of metastases and improves survival: long-term followup of a randomized clinical trial.  Janee Morn IM(1), Tangen CM, Ralene Muskrat  Herbie Drape MS, Lazarus Salines, Messing E, Forman J, Chin J, Swanson G, Canby-Hagino E, [place] [facility].  Chartered loss adjuster information: (1)[employer] [facility] [employer] [facility] [place], [place], [place], [place]. PURPOSE: Extraprostatic disease will be manifest in a third of men after radical prostatectomy. We present the long-term followup of a randomized clinical trial of radiotherapy to reduce the risk of subsequent metastatic disease and death.  MATERIALS AND METHODS: A total of 431 men with pT3N0M0 prostate cancer were randomized to 60 to 64 Gy adjuvant radiotherapy or observation. The primary study end point was metastasis-free survival.  RESULTS: Of 425 eligible men 211 were randomized to observation and 214 to adjuvant radiation. Of those men under observation 70 ultimately received radiotherapy. Metastasis-free survival was significantly greater with radiotherapy (93 of 214 events on the radiotherapy arm vs 114 of 211 events on observation; HR 0.71; 95% CI 0.54, 0.94; p = 0.016). Survival improved significantly with adjuvant radiation (88 deaths of 214 on the radiotherapy arm vs 110 deaths of 211 on observation; HR 0.72; 95% CI 0.55, 0.96; p = 0.023).  CONCLUSIONS: Adjuvant radiotherapy after radical prostatectomy for a man with pT3N0M0 prostate cancer significantly reduces the risk of metastasis and increases survival.  PMCID: [id number] PMID: [id number]

## 2022-12-06 ENCOUNTER — Ambulatory Visit
Admission: RE | Admit: 2022-12-06 | Discharge: 2022-12-06 | Disposition: A | Discharge status: Home / Self Care | Payer: BC Managed Care – PPO | Source: Ambulatory Visit | Attending: Radiation Oncology | Admitting: Radiation Oncology

## 2022-12-06 ENCOUNTER — Encounter: Payer: Self-pay | Admitting: Urology

## 2022-12-06 ENCOUNTER — Ambulatory Visit
Admission: RE | Admit: 2022-12-06 | Discharge: 2022-12-06 | Disposition: A | Discharge status: Home / Self Care | Payer: BC Managed Care – PPO | Source: Ambulatory Visit | Attending: Urology | Admitting: Urology

## 2022-12-06 VITALS — BP 107/64 | HR 71 | Temp 97.9°F | Resp 20 | Ht 66.0 in | Wt 191.8 lb

## 2022-12-06 DIAGNOSIS — Z79899 Other long term (current) drug therapy: Secondary | ICD-10-CM | POA: Insufficient documentation

## 2022-12-06 DIAGNOSIS — C61 Malignant neoplasm of prostate: Secondary | ICD-10-CM

## 2022-12-06 DIAGNOSIS — K219 Gastro-esophageal reflux disease without esophagitis: Secondary | ICD-10-CM | POA: Diagnosis not present

## 2022-12-06 DIAGNOSIS — Z87891 Personal history of nicotine dependence: Secondary | ICD-10-CM | POA: Diagnosis not present

## 2022-12-06 NOTE — Progress Notes (Signed)
Nursing interview for a  52 y/o man with biochemically recurrent prostate cancer s/p RALP in 10/2020 for pT3bN0, Gleason 4+5 adenocarcinoma of the prostate with pretreatment PSA of 5.1 and current post-operative PSA of 0.19.  Patient identity verified x2.  Patient reports straining to start urine flow, but w/ complete emptying of the bladder. No other issues conveyed at this time.  Meaningful use complete.  I-PSS score of 7-mild SHIM score of 1 Urinary management medications- None Urology appt- 12/2022 w/ [name] at [facility] [place]  Vitals- BP 107/64 (BP Location: Left Arm, Patient Position: Sitting, Cuff Size: Large)   Pulse 71   Temp 97.9 F (36.6 C) (Temporal)   Resp 20   Ht 5\' 6"  (1.676 m)   Wt 191 lb 12.8 oz (87 kg)   SpO2 99%   BMI 30.96 kg/m   This concludes the interview.   Timothy Favors, LPN

## 2022-12-17 ENCOUNTER — Encounter (HOSPITAL_COMMUNITY)
Admission: RE | Admit: 2022-12-17 | Discharge: 2022-12-17 | Disposition: A | Discharge status: Home / Self Care | Payer: BC Managed Care – PPO | Source: Ambulatory Visit | Attending: Urology | Admitting: Urology

## 2022-12-17 ENCOUNTER — Ambulatory Visit: Payer: BC Managed Care – PPO | Admitting: Radiation Oncology

## 2022-12-17 DIAGNOSIS — C61 Malignant neoplasm of prostate: Secondary | ICD-10-CM | POA: Insufficient documentation

## 2022-12-17 DIAGNOSIS — R9721 Rising PSA following treatment for malignant neoplasm of prostate: Secondary | ICD-10-CM | POA: Diagnosis present

## 2022-12-17 MED ORDER — PIFLIFOLASTAT F 18 (PYLARIFY) INJECTION
9.0000 | Freq: Once | INTRAVENOUS | Status: AC
  Administered 2022-12-17: 9.68 via INTRAVENOUS

## 2022-12-18 NOTE — Progress Notes (Signed)
RN placed request for STAT read on PSMA PET scan due to upcoming CT Simulation.

## 2022-12-20 ENCOUNTER — Ambulatory Visit
Admission: RE | Admit: 2022-12-20 | Discharge: 2022-12-20 | Disposition: A | Discharge status: Home / Self Care | Payer: BC Managed Care – PPO | Source: Ambulatory Visit | Attending: Radiation Oncology | Admitting: Radiation Oncology

## 2022-12-20 ENCOUNTER — Other Ambulatory Visit: Payer: Self-pay

## 2022-12-20 DIAGNOSIS — Z510 Encounter for antineoplastic radiation therapy: Secondary | ICD-10-CM | POA: Insufficient documentation

## 2022-12-20 DIAGNOSIS — C61 Malignant neoplasm of prostate: Secondary | ICD-10-CM | POA: Insufficient documentation

## 2022-12-20 NOTE — Progress Notes (Signed)
Additional STAT read request placed for PSMA PET from 6/10 for patient's upcoming CT Simulation on 6/13.

## 2022-12-21 NOTE — Progress Notes (Signed)
  Radiation Oncology         [phone]) [phone] ________________________________  Name: Timothy [name] MRN: [id number]  Date: 12/20/2022  DOB: April 20, 1971  SIMULATION AND TREATMENT PLANNING NOTE    ICD-10-CM   1. Malignant neoplasm of prostate (HCC)  C61       DIAGNOSIS:  52 y/o man with biochemically recurrent prostate cancer s/p RALP in 10/2020 for pT3bN0, Gleason 4+5 adenocarcinoma of the prostate with pretreatment PSA of 5.1 and current post-operative PSA of 0.19.  NARRATIVE:  The patient was brought to the CT Simulation planning suite.  Identity was confirmed.  All relevant records and images related to the planned course of therapy were reviewed.  The patient freely provided informed written consent to proceed with treatment after reviewing the details related to the planned course of therapy. The consent form was witnessed and verified by the simulation staff.  Then, the patient was set-up in a stable reproducible supine position for radiation therapy.  A vacuum lock pillow device was custom fabricated to position his legs in a reproducible immobilized position.  Then, I performed a urethrogram under sterile conditions to identify the prostatic bed.  CT images were obtained.  Surface markings were placed.  The CT images were loaded into the planning software.  Then the prostate bed target, pelvic lymph node target and avoidance structures including the rectum, bladder, bowel and hips were contoured.  Treatment planning then occurred.  The radiation prescription was entered and confirmed.  A total of one complex treatment devices were fabricated. I have requested : Intensity Modulated Radiotherapy (IMRT) is medically necessary for this case for the following reason:  Rectal sparing.[name]  PLAN:  The patient will receive 45 Gy in 25 fractions of 1.8 Gy, followed by a boost to the prostate bed to a total dose of 68.4 Gy with 13 additional fractions of 1.8  Gy.   ________________________________  Artist Pais Kathrynn Running, M.D.

## 2022-12-31 DIAGNOSIS — Z510 Encounter for antineoplastic radiation therapy: Secondary | ICD-10-CM | POA: Diagnosis not present

## 2023-01-03 ENCOUNTER — Ambulatory Visit
Admission: RE | Admit: 2023-01-03 | Discharge: 2023-01-03 | Disposition: A | Discharge status: Home / Self Care | Payer: BC Managed Care – PPO | Source: Ambulatory Visit | Attending: Radiation Oncology | Admitting: Radiation Oncology

## 2023-01-03 ENCOUNTER — Other Ambulatory Visit: Payer: Self-pay

## 2023-01-03 DIAGNOSIS — Z510 Encounter for antineoplastic radiation therapy: Secondary | ICD-10-CM | POA: Diagnosis not present

## 2023-01-03 LAB — RAD ONC ARIA SESSION SUMMARY
Course Elapsed Days: 0
Plan Fractions Treated to Date: 1
Plan Prescribed Dose Per Fraction: 1.8 Gy
Plan Total Fractions Prescribed: 25
Plan Total Prescribed Dose: 45 Gy
Reference Point Dosage Given to Date: 1.8 Gy
Reference Point Session Dosage Given: 1.8 Gy
Session Number: 1

## 2023-01-04 ENCOUNTER — Other Ambulatory Visit: Payer: Self-pay

## 2023-01-04 ENCOUNTER — Ambulatory Visit
Admission: RE | Admit: 2023-01-04 | Discharge: 2023-01-04 | Disposition: A | Discharge status: Home / Self Care | Payer: BC Managed Care – PPO | Source: Ambulatory Visit | Attending: Radiation Oncology | Admitting: Radiation Oncology

## 2023-01-04 DIAGNOSIS — Z510 Encounter for antineoplastic radiation therapy: Secondary | ICD-10-CM | POA: Diagnosis not present

## 2023-01-04 LAB — RAD ONC ARIA SESSION SUMMARY
Course Elapsed Days: 1
Plan Fractions Treated to Date: 2
Plan Prescribed Dose Per Fraction: 1.8 Gy
Plan Total Fractions Prescribed: 25
Plan Total Prescribed Dose: 45 Gy
Reference Point Dosage Given to Date: 3.6 Gy
Reference Point Session Dosage Given: 1.8 Gy
Session Number: 2

## 2023-01-07 ENCOUNTER — Other Ambulatory Visit: Payer: Self-pay

## 2023-01-07 ENCOUNTER — Ambulatory Visit
Admission: RE | Admit: 2023-01-07 | Discharge: 2023-01-07 | Disposition: A | Discharge status: Home / Self Care | Payer: BC Managed Care – PPO | Source: Ambulatory Visit | Attending: Radiation Oncology | Admitting: Radiation Oncology

## 2023-01-07 DIAGNOSIS — C61 Malignant neoplasm of prostate: Secondary | ICD-10-CM | POA: Insufficient documentation

## 2023-01-07 DIAGNOSIS — Z510 Encounter for antineoplastic radiation therapy: Secondary | ICD-10-CM | POA: Insufficient documentation

## 2023-01-07 LAB — RAD ONC ARIA SESSION SUMMARY
Course Elapsed Days: 4
Plan Fractions Treated to Date: 3
Plan Prescribed Dose Per Fraction: 1.8 Gy
Plan Total Fractions Prescribed: 25
Plan Total Prescribed Dose: 45 Gy
Reference Point Dosage Given to Date: 5.4 Gy
Reference Point Session Dosage Given: 1.8 Gy
Session Number: 3

## 2023-01-08 ENCOUNTER — Ambulatory Visit
Admission: RE | Admit: 2023-01-08 | Discharge: 2023-01-08 | Disposition: A | Discharge status: Home / Self Care | Payer: BC Managed Care – PPO | Source: Ambulatory Visit | Attending: Radiation Oncology | Admitting: Radiation Oncology

## 2023-01-08 ENCOUNTER — Other Ambulatory Visit: Payer: Self-pay

## 2023-01-08 DIAGNOSIS — Z510 Encounter for antineoplastic radiation therapy: Secondary | ICD-10-CM | POA: Diagnosis not present

## 2023-01-08 LAB — RAD ONC ARIA SESSION SUMMARY
Course Elapsed Days: 5
Plan Fractions Treated to Date: 4
Plan Prescribed Dose Per Fraction: 1.8 Gy
Plan Total Fractions Prescribed: 25
Plan Total Prescribed Dose: 45 Gy
Reference Point Dosage Given to Date: 7.2 Gy
Reference Point Session Dosage Given: 1.8 Gy
Session Number: 4

## 2023-01-09 ENCOUNTER — Other Ambulatory Visit: Payer: Self-pay

## 2023-01-09 ENCOUNTER — Ambulatory Visit
Admission: RE | Admit: 2023-01-09 | Discharge: 2023-01-09 | Disposition: A | Discharge status: Home / Self Care | Payer: BC Managed Care – PPO | Source: Ambulatory Visit | Attending: Radiation Oncology | Admitting: Radiation Oncology

## 2023-01-09 DIAGNOSIS — Z510 Encounter for antineoplastic radiation therapy: Secondary | ICD-10-CM | POA: Diagnosis not present

## 2023-01-09 LAB — RAD ONC ARIA SESSION SUMMARY
Course Elapsed Days: 6
Plan Fractions Treated to Date: 5
Plan Prescribed Dose Per Fraction: 1.8 Gy
Plan Total Fractions Prescribed: 25
Plan Total Prescribed Dose: 45 Gy
Reference Point Dosage Given to Date: 9 Gy
Reference Point Session Dosage Given: 1.8 Gy
Session Number: 5

## 2023-01-11 ENCOUNTER — Other Ambulatory Visit: Payer: Self-pay

## 2023-01-11 ENCOUNTER — Ambulatory Visit
Admission: RE | Admit: 2023-01-11 | Discharge: 2023-01-11 | Disposition: A | Discharge status: Home / Self Care | Payer: BC Managed Care – PPO | Source: Ambulatory Visit | Attending: Radiation Oncology | Admitting: Radiation Oncology

## 2023-01-11 ENCOUNTER — Ambulatory Visit: Admission: RE | Admit: 2023-01-11 | Payer: BC Managed Care – PPO | Source: Ambulatory Visit

## 2023-01-11 DIAGNOSIS — Z510 Encounter for antineoplastic radiation therapy: Secondary | ICD-10-CM | POA: Diagnosis not present

## 2023-01-11 LAB — RAD ONC ARIA SESSION SUMMARY
Course Elapsed Days: 8
Plan Fractions Treated to Date: 6
Plan Prescribed Dose Per Fraction: 1.8 Gy
Plan Total Fractions Prescribed: 25
Plan Total Prescribed Dose: 45 Gy
Reference Point Dosage Given to Date: 10.8 Gy
Reference Point Session Dosage Given: 1.8 Gy
Session Number: 6

## 2023-01-14 ENCOUNTER — Ambulatory Visit
Admission: RE | Admit: 2023-01-14 | Discharge: 2023-01-14 | Disposition: A | Discharge status: Home / Self Care | Payer: BC Managed Care – PPO | Source: Ambulatory Visit | Attending: Radiation Oncology | Admitting: Radiation Oncology

## 2023-01-14 ENCOUNTER — Telehealth: Payer: Self-pay | Admitting: Radiation Oncology

## 2023-01-14 ENCOUNTER — Other Ambulatory Visit: Payer: Self-pay

## 2023-01-14 DIAGNOSIS — Z510 Encounter for antineoplastic radiation therapy: Secondary | ICD-10-CM | POA: Diagnosis not present

## 2023-01-14 LAB — RAD ONC ARIA SESSION SUMMARY
Course Elapsed Days: 11
Plan Fractions Treated to Date: 7
Plan Prescribed Dose Per Fraction: 1.8 Gy
Plan Total Fractions Prescribed: 25
Plan Total Prescribed Dose: 45 Gy
Reference Point Dosage Given to Date: 12.6 Gy
Reference Point Session Dosage Given: 1.8 Gy
Session Number: 7

## 2023-01-14 NOTE — Telephone Encounter (Signed)
7/8 @ 3:34 pm patient called to let someone know he is running late, but on his way, should be here for his treatment appt in about 10 mins.  Sent email and called L4 machine spoke to Jewel Baize so they are aware.

## 2023-01-15 ENCOUNTER — Ambulatory Visit
Admission: RE | Admit: 2023-01-15 | Discharge: 2023-01-15 | Disposition: A | Discharge status: Home / Self Care | Payer: BC Managed Care – PPO | Source: Ambulatory Visit | Attending: Radiation Oncology | Admitting: Radiation Oncology

## 2023-01-15 ENCOUNTER — Other Ambulatory Visit: Payer: Self-pay

## 2023-01-15 ENCOUNTER — Ambulatory Visit: Payer: BC Managed Care – PPO

## 2023-01-15 DIAGNOSIS — Z510 Encounter for antineoplastic radiation therapy: Secondary | ICD-10-CM | POA: Diagnosis not present

## 2023-01-15 LAB — RAD ONC ARIA SESSION SUMMARY
Course Elapsed Days: 12
Plan Fractions Treated to Date: 8
Plan Prescribed Dose Per Fraction: 1.8 Gy
Plan Total Fractions Prescribed: 25
Plan Total Prescribed Dose: 45 Gy
Reference Point Dosage Given to Date: 14.4 Gy
Reference Point Session Dosage Given: 1.8 Gy
Session Number: 8

## 2023-01-16 ENCOUNTER — Ambulatory Visit
Admission: RE | Admit: 2023-01-16 | Discharge: 2023-01-16 | Disposition: A | Discharge status: Home / Self Care | Payer: BC Managed Care – PPO | Source: Ambulatory Visit | Attending: Radiation Oncology | Admitting: Radiation Oncology

## 2023-01-16 ENCOUNTER — Other Ambulatory Visit: Payer: Self-pay

## 2023-01-16 DIAGNOSIS — Z510 Encounter for antineoplastic radiation therapy: Secondary | ICD-10-CM | POA: Diagnosis not present

## 2023-01-16 LAB — RAD ONC ARIA SESSION SUMMARY
Course Elapsed Days: 13
Plan Fractions Treated to Date: 9
Plan Prescribed Dose Per Fraction: 1.8 Gy
Plan Total Fractions Prescribed: 25
Plan Total Prescribed Dose: 45 Gy
Reference Point Dosage Given to Date: 16.2 Gy
Reference Point Session Dosage Given: 1.8 Gy
Session Number: 9

## 2023-01-17 ENCOUNTER — Other Ambulatory Visit: Payer: Self-pay

## 2023-01-17 ENCOUNTER — Ambulatory Visit
Admission: RE | Admit: 2023-01-17 | Discharge: 2023-01-17 | Disposition: A | Discharge status: Home / Self Care | Payer: BC Managed Care – PPO | Source: Ambulatory Visit | Attending: Radiation Oncology | Admitting: Radiation Oncology

## 2023-01-17 DIAGNOSIS — Z510 Encounter for antineoplastic radiation therapy: Secondary | ICD-10-CM | POA: Diagnosis not present

## 2023-01-17 LAB — RAD ONC ARIA SESSION SUMMARY
Course Elapsed Days: 14
Plan Fractions Treated to Date: 10
Plan Prescribed Dose Per Fraction: 1.8 Gy
Plan Total Fractions Prescribed: 25
Plan Total Prescribed Dose: 45 Gy
Reference Point Dosage Given to Date: 18 Gy
Reference Point Session Dosage Given: 1.8 Gy
Session Number: 10

## 2023-01-18 ENCOUNTER — Ambulatory Visit: Payer: BC Managed Care – PPO

## 2023-01-21 ENCOUNTER — Ambulatory Visit
Admission: RE | Admit: 2023-01-21 | Discharge: 2023-01-21 | Disposition: A | Discharge status: Home / Self Care | Payer: BC Managed Care – PPO | Source: Ambulatory Visit | Attending: Radiation Oncology | Admitting: Radiation Oncology

## 2023-01-21 ENCOUNTER — Other Ambulatory Visit: Payer: Self-pay

## 2023-01-21 DIAGNOSIS — Z510 Encounter for antineoplastic radiation therapy: Secondary | ICD-10-CM | POA: Diagnosis not present

## 2023-01-21 LAB — RAD ONC ARIA SESSION SUMMARY
Course Elapsed Days: 18
Plan Fractions Treated to Date: 11
Plan Prescribed Dose Per Fraction: 1.8 Gy
Plan Total Fractions Prescribed: 25
Plan Total Prescribed Dose: 45 Gy
Reference Point Dosage Given to Date: 19.8 Gy
Reference Point Session Dosage Given: 1.8 Gy
Session Number: 11

## 2023-01-22 ENCOUNTER — Ambulatory Visit: Payer: BC Managed Care – PPO

## 2023-01-22 ENCOUNTER — Ambulatory Visit
Admission: RE | Admit: 2023-01-22 | Discharge: 2023-01-22 | Disposition: A | Discharge status: Home / Self Care | Payer: BC Managed Care – PPO | Source: Ambulatory Visit | Attending: Radiation Oncology | Admitting: Radiation Oncology

## 2023-01-22 ENCOUNTER — Other Ambulatory Visit: Payer: Self-pay

## 2023-01-22 DIAGNOSIS — Z510 Encounter for antineoplastic radiation therapy: Secondary | ICD-10-CM | POA: Diagnosis not present

## 2023-01-22 LAB — RAD ONC ARIA SESSION SUMMARY
Course Elapsed Days: 19
Plan Fractions Treated to Date: 12
Plan Prescribed Dose Per Fraction: 1.8 Gy
Plan Total Fractions Prescribed: 25
Plan Total Prescribed Dose: 45 Gy
Reference Point Dosage Given to Date: 21.6 Gy
Reference Point Session Dosage Given: 1.8 Gy
Session Number: 12

## 2023-01-23 ENCOUNTER — Ambulatory Visit
Admission: RE | Admit: 2023-01-23 | Discharge: 2023-01-23 | Disposition: A | Discharge status: Home / Self Care | Payer: BC Managed Care – PPO | Source: Ambulatory Visit | Attending: Radiation Oncology | Admitting: Radiation Oncology

## 2023-01-23 ENCOUNTER — Other Ambulatory Visit: Payer: Self-pay

## 2023-01-23 DIAGNOSIS — Z510 Encounter for antineoplastic radiation therapy: Secondary | ICD-10-CM | POA: Diagnosis not present

## 2023-01-23 LAB — RAD ONC ARIA SESSION SUMMARY
Course Elapsed Days: 20
Plan Fractions Treated to Date: 13
Plan Prescribed Dose Per Fraction: 1.8 Gy
Plan Total Fractions Prescribed: 25
Plan Total Prescribed Dose: 45 Gy
Reference Point Dosage Given to Date: 23.4 Gy
Reference Point Session Dosage Given: 1.8 Gy
Session Number: 13

## 2023-01-24 ENCOUNTER — Other Ambulatory Visit: Payer: Self-pay

## 2023-01-24 ENCOUNTER — Ambulatory Visit
Admission: RE | Admit: 2023-01-24 | Discharge: 2023-01-24 | Disposition: A | Discharge status: Home / Self Care | Payer: BC Managed Care – PPO | Source: Ambulatory Visit | Attending: Radiation Oncology | Admitting: Radiation Oncology

## 2023-01-24 DIAGNOSIS — Z510 Encounter for antineoplastic radiation therapy: Secondary | ICD-10-CM | POA: Diagnosis not present

## 2023-01-24 LAB — RAD ONC ARIA SESSION SUMMARY
Course Elapsed Days: 21
Plan Fractions Treated to Date: 14
Plan Prescribed Dose Per Fraction: 1.8 Gy
Plan Total Fractions Prescribed: 25
Plan Total Prescribed Dose: 45 Gy
Reference Point Dosage Given to Date: 25.2 Gy
Reference Point Session Dosage Given: 1.8 Gy
Session Number: 14

## 2023-01-25 ENCOUNTER — Other Ambulatory Visit: Payer: Self-pay

## 2023-01-25 ENCOUNTER — Ambulatory Visit
Admission: RE | Admit: 2023-01-25 | Discharge: 2023-01-25 | Disposition: A | Discharge status: Home / Self Care | Payer: BC Managed Care – PPO | Source: Ambulatory Visit | Attending: Radiation Oncology | Admitting: Radiation Oncology

## 2023-01-25 DIAGNOSIS — Z510 Encounter for antineoplastic radiation therapy: Secondary | ICD-10-CM | POA: Diagnosis not present

## 2023-01-25 LAB — RAD ONC ARIA SESSION SUMMARY
Course Elapsed Days: 22
Plan Fractions Treated to Date: 15
Plan Prescribed Dose Per Fraction: 1.8 Gy
Plan Total Fractions Prescribed: 25
Plan Total Prescribed Dose: 45 Gy
Reference Point Dosage Given to Date: 27 Gy
Reference Point Session Dosage Given: 1.8 Gy
Session Number: 15

## 2023-01-28 ENCOUNTER — Ambulatory Visit
Admission: RE | Admit: 2023-01-28 | Discharge: 2023-01-28 | Disposition: A | Discharge status: Home / Self Care | Payer: BC Managed Care – PPO | Source: Ambulatory Visit | Attending: Radiation Oncology | Admitting: Radiation Oncology

## 2023-01-28 ENCOUNTER — Other Ambulatory Visit: Payer: Self-pay

## 2023-01-28 DIAGNOSIS — Z510 Encounter for antineoplastic radiation therapy: Secondary | ICD-10-CM | POA: Diagnosis not present

## 2023-01-28 LAB — RAD ONC ARIA SESSION SUMMARY
Course Elapsed Days: 25
Plan Fractions Treated to Date: 16
Plan Prescribed Dose Per Fraction: 1.8 Gy
Plan Total Fractions Prescribed: 25
Plan Total Prescribed Dose: 45 Gy
Reference Point Dosage Given to Date: 28.8 Gy
Reference Point Session Dosage Given: 1.8 Gy
Session Number: 16

## 2023-01-29 ENCOUNTER — Other Ambulatory Visit: Payer: Self-pay

## 2023-01-29 ENCOUNTER — Ambulatory Visit
Admission: RE | Admit: 2023-01-29 | Discharge: 2023-01-29 | Disposition: A | Discharge status: Home / Self Care | Payer: BC Managed Care – PPO | Source: Ambulatory Visit | Attending: Radiation Oncology | Admitting: Radiation Oncology

## 2023-01-29 DIAGNOSIS — Z510 Encounter for antineoplastic radiation therapy: Secondary | ICD-10-CM | POA: Diagnosis not present

## 2023-01-29 LAB — RAD ONC ARIA SESSION SUMMARY
Course Elapsed Days: 26
Plan Fractions Treated to Date: 17
Plan Prescribed Dose Per Fraction: 1.8 Gy
Plan Total Fractions Prescribed: 25
Plan Total Prescribed Dose: 45 Gy
Reference Point Dosage Given to Date: 30.6 Gy
Reference Point Session Dosage Given: 1.8 Gy
Session Number: 17

## 2023-01-30 ENCOUNTER — Ambulatory Visit
Admission: RE | Admit: 2023-01-30 | Discharge: 2023-01-30 | Disposition: A | Discharge status: Home / Self Care | Payer: BC Managed Care – PPO | Source: Ambulatory Visit | Attending: Radiation Oncology | Admitting: Radiation Oncology

## 2023-01-30 ENCOUNTER — Other Ambulatory Visit: Payer: Self-pay

## 2023-01-30 DIAGNOSIS — Z510 Encounter for antineoplastic radiation therapy: Secondary | ICD-10-CM | POA: Diagnosis not present

## 2023-01-30 LAB — RAD ONC ARIA SESSION SUMMARY
Course Elapsed Days: 27
Plan Fractions Treated to Date: 18
Plan Prescribed Dose Per Fraction: 1.8 Gy
Plan Total Fractions Prescribed: 25
Plan Total Prescribed Dose: 45 Gy
Reference Point Dosage Given to Date: 32.4 Gy
Reference Point Session Dosage Given: 1.8 Gy
Session Number: 18

## 2023-01-31 ENCOUNTER — Ambulatory Visit
Admission: RE | Admit: 2023-01-31 | Discharge: 2023-01-31 | Disposition: A | Discharge status: Home / Self Care | Payer: BC Managed Care – PPO | Source: Ambulatory Visit | Attending: Radiation Oncology | Admitting: Radiation Oncology

## 2023-01-31 ENCOUNTER — Other Ambulatory Visit: Payer: Self-pay

## 2023-01-31 DIAGNOSIS — Z510 Encounter for antineoplastic radiation therapy: Secondary | ICD-10-CM | POA: Diagnosis not present

## 2023-01-31 LAB — RAD ONC ARIA SESSION SUMMARY
Course Elapsed Days: 28
Plan Fractions Treated to Date: 19
Plan Prescribed Dose Per Fraction: 1.8 Gy
Plan Total Fractions Prescribed: 25
Plan Total Prescribed Dose: 45 Gy
Reference Point Dosage Given to Date: 34.2 Gy
Reference Point Session Dosage Given: 1.8 Gy
Session Number: 19

## 2023-02-01 ENCOUNTER — Ambulatory Visit
Admission: RE | Admit: 2023-02-01 | Discharge: 2023-02-01 | Disposition: A | Discharge status: Home / Self Care | Payer: BC Managed Care – PPO | Source: Ambulatory Visit | Attending: Radiation Oncology | Admitting: Radiation Oncology

## 2023-02-01 ENCOUNTER — Other Ambulatory Visit: Payer: Self-pay

## 2023-02-01 DIAGNOSIS — Z510 Encounter for antineoplastic radiation therapy: Secondary | ICD-10-CM | POA: Diagnosis not present

## 2023-02-01 LAB — RAD ONC ARIA SESSION SUMMARY
Course Elapsed Days: 29
Plan Fractions Treated to Date: 20
Plan Prescribed Dose Per Fraction: 1.8 Gy
Plan Total Fractions Prescribed: 25
Plan Total Prescribed Dose: 45 Gy
Reference Point Dosage Given to Date: 36 Gy
Reference Point Session Dosage Given: 1.8 Gy
Session Number: 20

## 2023-02-04 ENCOUNTER — Other Ambulatory Visit: Payer: Self-pay

## 2023-02-04 ENCOUNTER — Ambulatory Visit
Admission: RE | Admit: 2023-02-04 | Discharge: 2023-02-04 | Disposition: A | Discharge status: Home / Self Care | Payer: BC Managed Care – PPO | Source: Ambulatory Visit | Attending: Radiation Oncology | Admitting: Radiation Oncology

## 2023-02-04 DIAGNOSIS — Z510 Encounter for antineoplastic radiation therapy: Secondary | ICD-10-CM | POA: Diagnosis not present

## 2023-02-04 LAB — RAD ONC ARIA SESSION SUMMARY
Course Elapsed Days: 32
Plan Fractions Treated to Date: 21
Plan Prescribed Dose Per Fraction: 1.8 Gy
Plan Total Fractions Prescribed: 25
Plan Total Prescribed Dose: 45 Gy
Reference Point Dosage Given to Date: 37.8 Gy
Reference Point Session Dosage Given: 1.8 Gy
Session Number: 21

## 2023-02-05 ENCOUNTER — Ambulatory Visit
Admission: RE | Admit: 2023-02-05 | Discharge: 2023-02-05 | Disposition: A | Discharge status: Home / Self Care | Payer: BC Managed Care – PPO | Source: Ambulatory Visit | Attending: Radiation Oncology | Admitting: Radiation Oncology

## 2023-02-05 ENCOUNTER — Other Ambulatory Visit: Payer: Self-pay

## 2023-02-05 DIAGNOSIS — Z510 Encounter for antineoplastic radiation therapy: Secondary | ICD-10-CM | POA: Diagnosis not present

## 2023-02-05 LAB — RAD ONC ARIA SESSION SUMMARY
Course Elapsed Days: 33
Plan Fractions Treated to Date: 22
Plan Prescribed Dose Per Fraction: 1.8 Gy
Plan Total Fractions Prescribed: 25
Plan Total Prescribed Dose: 45 Gy
Reference Point Dosage Given to Date: 39.6 Gy
Reference Point Session Dosage Given: 1.8 Gy
Session Number: 22

## 2023-02-06 ENCOUNTER — Other Ambulatory Visit: Payer: Self-pay

## 2023-02-06 ENCOUNTER — Ambulatory Visit
Admission: RE | Admit: 2023-02-06 | Discharge: 2023-02-06 | Disposition: A | Discharge status: Home / Self Care | Payer: BC Managed Care – PPO | Source: Ambulatory Visit | Attending: Radiation Oncology | Admitting: Radiation Oncology

## 2023-02-06 DIAGNOSIS — Z510 Encounter for antineoplastic radiation therapy: Secondary | ICD-10-CM | POA: Diagnosis not present

## 2023-02-06 LAB — RAD ONC ARIA SESSION SUMMARY
Course Elapsed Days: 34
Plan Fractions Treated to Date: 23
Plan Prescribed Dose Per Fraction: 1.8 Gy
Plan Total Fractions Prescribed: 25
Plan Total Prescribed Dose: 45 Gy
Reference Point Dosage Given to Date: 41.4 Gy
Reference Point Session Dosage Given: 1.8 Gy
Session Number: 23

## 2023-02-07 ENCOUNTER — Other Ambulatory Visit: Payer: Self-pay

## 2023-02-07 ENCOUNTER — Ambulatory Visit
Admission: RE | Admit: 2023-02-07 | Discharge: 2023-02-07 | Disposition: A | Discharge status: Home / Self Care | Payer: BC Managed Care – PPO | Source: Ambulatory Visit | Attending: Radiation Oncology | Admitting: Radiation Oncology

## 2023-02-07 DIAGNOSIS — Z510 Encounter for antineoplastic radiation therapy: Secondary | ICD-10-CM | POA: Insufficient documentation

## 2023-02-07 DIAGNOSIS — C61 Malignant neoplasm of prostate: Secondary | ICD-10-CM | POA: Diagnosis present

## 2023-02-07 LAB — RAD ONC ARIA SESSION SUMMARY
Course Elapsed Days: 35
Plan Fractions Treated to Date: 24
Plan Prescribed Dose Per Fraction: 1.8 Gy
Plan Total Fractions Prescribed: 25
Plan Total Prescribed Dose: 45 Gy
Reference Point Dosage Given to Date: 43.2 Gy
Reference Point Session Dosage Given: 1.8 Gy
Session Number: 24

## 2023-02-08 ENCOUNTER — Ambulatory Visit
Admission: RE | Admit: 2023-02-08 | Discharge: 2023-02-08 | Disposition: A | Discharge status: Home / Self Care | Payer: BC Managed Care – PPO | Source: Ambulatory Visit | Attending: Radiation Oncology | Admitting: Radiation Oncology

## 2023-02-08 ENCOUNTER — Ambulatory Visit: Payer: BC Managed Care – PPO

## 2023-02-08 ENCOUNTER — Other Ambulatory Visit: Payer: Self-pay

## 2023-02-08 DIAGNOSIS — Z510 Encounter for antineoplastic radiation therapy: Secondary | ICD-10-CM | POA: Diagnosis not present

## 2023-02-08 LAB — RAD ONC ARIA SESSION SUMMARY
Course Elapsed Days: 36
Plan Fractions Treated to Date: 25
Plan Prescribed Dose Per Fraction: 1.8 Gy
Plan Total Fractions Prescribed: 25
Plan Total Prescribed Dose: 45 Gy
Reference Point Dosage Given to Date: 45 Gy
Reference Point Session Dosage Given: 1.8 Gy
Session Number: 25

## 2023-02-11 ENCOUNTER — Ambulatory Visit: Payer: BC Managed Care – PPO

## 2023-02-11 ENCOUNTER — Other Ambulatory Visit: Payer: Self-pay

## 2023-02-11 DIAGNOSIS — Z510 Encounter for antineoplastic radiation therapy: Secondary | ICD-10-CM | POA: Diagnosis not present

## 2023-02-11 LAB — RAD ONC ARIA SESSION SUMMARY
Course Elapsed Days: 39
Plan Fractions Treated to Date: 1
Plan Prescribed Dose Per Fraction: 1.8 Gy
Plan Total Fractions Prescribed: 13
Plan Total Prescribed Dose: 23.4 Gy
Reference Point Dosage Given to Date: 1.8 Gy
Reference Point Session Dosage Given: 1.8 Gy
Session Number: 26

## 2023-02-12 ENCOUNTER — Other Ambulatory Visit: Payer: Self-pay

## 2023-02-12 ENCOUNTER — Ambulatory Visit
Admission: RE | Admit: 2023-02-12 | Discharge: 2023-02-12 | Disposition: A | Discharge status: Home / Self Care | Payer: BC Managed Care – PPO | Source: Ambulatory Visit | Attending: Radiation Oncology | Admitting: Radiation Oncology

## 2023-02-12 DIAGNOSIS — Z510 Encounter for antineoplastic radiation therapy: Secondary | ICD-10-CM | POA: Diagnosis not present

## 2023-02-12 LAB — RAD ONC ARIA SESSION SUMMARY
Course Elapsed Days: 40
Plan Fractions Treated to Date: 2
Plan Prescribed Dose Per Fraction: 1.8 Gy
Plan Total Fractions Prescribed: 13
Plan Total Prescribed Dose: 23.4 Gy
Reference Point Dosage Given to Date: 3.6 Gy
Reference Point Session Dosage Given: 1.8 Gy
Session Number: 27

## 2023-02-13 ENCOUNTER — Other Ambulatory Visit: Payer: Self-pay

## 2023-02-13 ENCOUNTER — Ambulatory Visit
Admission: RE | Admit: 2023-02-13 | Discharge: 2023-02-13 | Disposition: A | Discharge status: Home / Self Care | Payer: BC Managed Care – PPO | Source: Ambulatory Visit | Attending: Radiation Oncology | Admitting: Radiation Oncology

## 2023-02-13 DIAGNOSIS — Z510 Encounter for antineoplastic radiation therapy: Secondary | ICD-10-CM | POA: Diagnosis not present

## 2023-02-13 LAB — RAD ONC ARIA SESSION SUMMARY
Course Elapsed Days: 41
Plan Fractions Treated to Date: 3
Plan Prescribed Dose Per Fraction: 1.8 Gy
Plan Total Fractions Prescribed: 13
Plan Total Prescribed Dose: 23.4 Gy
Reference Point Dosage Given to Date: 5.4 Gy
Reference Point Session Dosage Given: 1.8 Gy
Session Number: 28

## 2023-02-14 ENCOUNTER — Ambulatory Visit: Payer: BC Managed Care – PPO

## 2023-02-15 ENCOUNTER — Other Ambulatory Visit: Payer: Self-pay

## 2023-02-15 ENCOUNTER — Ambulatory Visit: Payer: BC Managed Care – PPO

## 2023-02-15 ENCOUNTER — Ambulatory Visit
Admission: RE | Admit: 2023-02-15 | Discharge: 2023-02-15 | Disposition: A | Discharge status: Home / Self Care | Payer: BC Managed Care – PPO | Source: Ambulatory Visit | Attending: Radiation Oncology | Admitting: Radiation Oncology

## 2023-02-15 DIAGNOSIS — Z510 Encounter for antineoplastic radiation therapy: Secondary | ICD-10-CM | POA: Diagnosis not present

## 2023-02-15 LAB — RAD ONC ARIA SESSION SUMMARY
Course Elapsed Days: 43
Plan Fractions Treated to Date: 4
Plan Prescribed Dose Per Fraction: 1.8 Gy
Plan Total Fractions Prescribed: 13
Plan Total Prescribed Dose: 23.4 Gy
Reference Point Dosage Given to Date: 7.2 Gy
Reference Point Session Dosage Given: 1.8 Gy
Session Number: 29

## 2023-02-18 ENCOUNTER — Other Ambulatory Visit: Payer: Self-pay

## 2023-02-18 ENCOUNTER — Ambulatory Visit
Admission: RE | Admit: 2023-02-18 | Discharge: 2023-02-18 | Disposition: A | Discharge status: Home / Self Care | Payer: BC Managed Care – PPO | Source: Ambulatory Visit | Attending: Radiation Oncology | Admitting: Radiation Oncology

## 2023-02-18 DIAGNOSIS — Z510 Encounter for antineoplastic radiation therapy: Secondary | ICD-10-CM | POA: Diagnosis not present

## 2023-02-18 LAB — RAD ONC ARIA SESSION SUMMARY
Course Elapsed Days: 46
Plan Fractions Treated to Date: 5
Plan Prescribed Dose Per Fraction: 1.8 Gy
Plan Total Fractions Prescribed: 13
Plan Total Prescribed Dose: 23.4 Gy
Reference Point Dosage Given to Date: 9 Gy
Reference Point Session Dosage Given: 1.8 Gy
Session Number: 30

## 2023-02-19 ENCOUNTER — Other Ambulatory Visit: Payer: Self-pay

## 2023-02-19 ENCOUNTER — Ambulatory Visit
Admission: RE | Admit: 2023-02-19 | Discharge: 2023-02-19 | Disposition: A | Discharge status: Home / Self Care | Payer: BC Managed Care – PPO | Source: Ambulatory Visit | Attending: Radiation Oncology | Admitting: Radiation Oncology

## 2023-02-19 DIAGNOSIS — Z510 Encounter for antineoplastic radiation therapy: Secondary | ICD-10-CM | POA: Diagnosis not present

## 2023-02-19 LAB — RAD ONC ARIA SESSION SUMMARY
Course Elapsed Days: 47
Plan Fractions Treated to Date: 6
Plan Prescribed Dose Per Fraction: 1.8 Gy
Plan Total Fractions Prescribed: 13
Plan Total Prescribed Dose: 23.4 Gy
Reference Point Dosage Given to Date: 10.8 Gy
Reference Point Session Dosage Given: 1.8 Gy
Session Number: 31

## 2023-02-20 ENCOUNTER — Ambulatory Visit
Admission: RE | Admit: 2023-02-20 | Discharge: 2023-02-20 | Disposition: A | Discharge status: Home / Self Care | Payer: BC Managed Care – PPO | Source: Ambulatory Visit | Attending: Radiation Oncology | Admitting: Radiation Oncology

## 2023-02-20 ENCOUNTER — Other Ambulatory Visit: Payer: Self-pay

## 2023-02-20 DIAGNOSIS — Z510 Encounter for antineoplastic radiation therapy: Secondary | ICD-10-CM | POA: Diagnosis not present

## 2023-02-20 LAB — RAD ONC ARIA SESSION SUMMARY
Course Elapsed Days: 48
Plan Fractions Treated to Date: 7
Plan Prescribed Dose Per Fraction: 1.8 Gy
Plan Total Fractions Prescribed: 13
Plan Total Prescribed Dose: 23.4 Gy
Reference Point Dosage Given to Date: 12.6 Gy
Reference Point Session Dosage Given: 1.8 Gy
Session Number: 32

## 2023-02-21 ENCOUNTER — Other Ambulatory Visit: Payer: Self-pay

## 2023-02-21 ENCOUNTER — Ambulatory Visit
Admission: RE | Admit: 2023-02-21 | Discharge: 2023-02-21 | Disposition: A | Discharge status: Home / Self Care | Payer: BC Managed Care – PPO | Source: Ambulatory Visit | Attending: Radiation Oncology | Admitting: Radiation Oncology

## 2023-02-21 DIAGNOSIS — Z510 Encounter for antineoplastic radiation therapy: Secondary | ICD-10-CM | POA: Diagnosis not present

## 2023-02-21 LAB — RAD ONC ARIA SESSION SUMMARY
Course Elapsed Days: 49
Plan Fractions Treated to Date: 8
Plan Prescribed Dose Per Fraction: 1.8 Gy
Plan Total Fractions Prescribed: 13
Plan Total Prescribed Dose: 23.4 Gy
Reference Point Dosage Given to Date: 14.4 Gy
Reference Point Session Dosage Given: 1.8 Gy
Session Number: 33

## 2023-02-22 ENCOUNTER — Ambulatory Visit
Admission: RE | Admit: 2023-02-22 | Discharge: 2023-02-22 | Disposition: A | Discharge status: Home / Self Care | Payer: BC Managed Care – PPO | Source: Ambulatory Visit | Attending: Radiation Oncology | Admitting: Radiation Oncology

## 2023-02-22 ENCOUNTER — Other Ambulatory Visit: Payer: Self-pay

## 2023-02-22 DIAGNOSIS — Z510 Encounter for antineoplastic radiation therapy: Secondary | ICD-10-CM | POA: Diagnosis not present

## 2023-02-22 LAB — RAD ONC ARIA SESSION SUMMARY
Course Elapsed Days: 50
Plan Fractions Treated to Date: 9
Plan Prescribed Dose Per Fraction: 1.8 Gy
Plan Total Fractions Prescribed: 13
Plan Total Prescribed Dose: 23.4 Gy
Reference Point Dosage Given to Date: 16.2 Gy
Reference Point Session Dosage Given: 1.8 Gy
Session Number: 34

## 2023-02-25 ENCOUNTER — Other Ambulatory Visit: Payer: Self-pay

## 2023-02-25 ENCOUNTER — Ambulatory Visit
Admission: RE | Admit: 2023-02-25 | Discharge: 2023-02-25 | Disposition: A | Discharge status: Home / Self Care | Payer: BC Managed Care – PPO | Source: Ambulatory Visit | Attending: Radiation Oncology | Admitting: Radiation Oncology

## 2023-02-25 DIAGNOSIS — Z510 Encounter for antineoplastic radiation therapy: Secondary | ICD-10-CM | POA: Diagnosis not present

## 2023-02-25 LAB — RAD ONC ARIA SESSION SUMMARY
Course Elapsed Days: 53
Plan Fractions Treated to Date: 10
Plan Prescribed Dose Per Fraction: 1.8 Gy
Plan Total Fractions Prescribed: 13
Plan Total Prescribed Dose: 23.4 Gy
Reference Point Dosage Given to Date: 18 Gy
Reference Point Session Dosage Given: 1.8 Gy
Session Number: 35

## 2023-02-26 ENCOUNTER — Other Ambulatory Visit: Payer: Self-pay

## 2023-02-26 ENCOUNTER — Ambulatory Visit
Admission: RE | Admit: 2023-02-26 | Discharge: 2023-02-26 | Disposition: A | Discharge status: Home / Self Care | Payer: BC Managed Care – PPO | Source: Ambulatory Visit | Attending: Radiation Oncology | Admitting: Radiation Oncology

## 2023-02-26 ENCOUNTER — Ambulatory Visit: Payer: BC Managed Care – PPO

## 2023-02-26 DIAGNOSIS — Z510 Encounter for antineoplastic radiation therapy: Secondary | ICD-10-CM | POA: Diagnosis not present

## 2023-02-26 LAB — RAD ONC ARIA SESSION SUMMARY
Course Elapsed Days: 54
Plan Fractions Treated to Date: 11
Plan Prescribed Dose Per Fraction: 1.8 Gy
Plan Total Fractions Prescribed: 13
Plan Total Prescribed Dose: 23.4 Gy
Reference Point Dosage Given to Date: 19.8 Gy
Reference Point Session Dosage Given: 1.8 Gy
Session Number: 36

## 2023-02-27 ENCOUNTER — Ambulatory Visit: Payer: BC Managed Care – PPO

## 2023-02-27 ENCOUNTER — Ambulatory Visit: Admission: RE | Admit: 2023-02-27 | Payer: BC Managed Care – PPO | Source: Ambulatory Visit

## 2023-02-27 ENCOUNTER — Other Ambulatory Visit: Payer: Self-pay

## 2023-02-27 DIAGNOSIS — Z510 Encounter for antineoplastic radiation therapy: Secondary | ICD-10-CM | POA: Diagnosis not present

## 2023-02-27 LAB — RAD ONC ARIA SESSION SUMMARY
Course Elapsed Days: 55
Plan Fractions Treated to Date: 12
Plan Prescribed Dose Per Fraction: 1.8 Gy
Plan Total Fractions Prescribed: 13
Plan Total Prescribed Dose: 23.4 Gy
Reference Point Dosage Given to Date: 21.6 Gy
Reference Point Session Dosage Given: 1.8 Gy
Session Number: 37

## 2023-02-28 ENCOUNTER — Ambulatory Visit
Admission: RE | Admit: 2023-02-28 | Discharge: 2023-02-28 | Disposition: A | Discharge status: Home / Self Care | Payer: BC Managed Care – PPO | Source: Ambulatory Visit | Attending: Radiation Oncology | Admitting: Radiation Oncology

## 2023-02-28 ENCOUNTER — Other Ambulatory Visit: Payer: Self-pay

## 2023-02-28 ENCOUNTER — Ambulatory Visit: Payer: BC Managed Care – PPO

## 2023-02-28 DIAGNOSIS — Z510 Encounter for antineoplastic radiation therapy: Secondary | ICD-10-CM | POA: Diagnosis not present

## 2023-02-28 DIAGNOSIS — C61 Malignant neoplasm of prostate: Secondary | ICD-10-CM

## 2023-02-28 LAB — RAD ONC ARIA SESSION SUMMARY
Course Elapsed Days: 56
Plan Fractions Treated to Date: 13
Plan Prescribed Dose Per Fraction: 1.8 Gy
Plan Total Fractions Prescribed: 13
Plan Total Prescribed Dose: 23.4 Gy
Reference Point Dosage Given to Date: 23.4 Gy
Reference Point Session Dosage Given: 1.8 Gy
Session Number: 38

## 2023-03-01 NOTE — Radiation Completion Notes (Addendum)
Radiation Oncology         [phone] ________________________________  Name: Timothy [name] MRN: [id number]  Date: 02/28/2023  DOB: September 19, 1970  Patient Name: Timothy [name], Timothy [name] MRN: [id number] Date of Birth: 1971-02-18 Referring Physician: Jettie Pagan, M.D. Date of Service: 2023-03-01 Radiation Oncologist: Margaretmary Bayley, M.D. [facility] [place]     RADIATION ONCOLOGY END OF TREATMENT NOTE     Diagnosis:  52 y/o man with biochemically recurrent prostate cancer s/p RALP in 10/2020 for pT3bN0, Gleason 4+5 adenocarcinoma of the prostate with pretreatment PSA of 5.1 and current post-operative PSA of 0.19.   Intent: Curative     ==========DELIVERED PLANS==========  First Treatment Date: 2023-01-03 - Last Treatment Date: 2023-02-28   Plan Name: ProstBed Site: Prostate Bed Technique: IMRT Mode: Photon Dose Per Fraction: 1.8 Gy Prescribed Dose (Delivered / Prescribed): 45 Gy / 45 Gy Prescribed Fxs (Delivered / Prescribed): 25 / 25   Plan Name: ProstBed_Bst Site: Prostate Bed Technique: IMRT Mode: Photon Dose Per Fraction: 1.8 Gy Prescribed Dose (Delivered / Prescribed): 23.4 Gy / 23.4 Gy Prescribed Fxs (Delivered / Prescribed): 13 / 13     ==========ON TREATMENT VISIT DATES========== 2023-01-04, 2023-01-11, 2023-01-25, 2023-02-01, 2023-02-08, 2023-02-18, 2023-02-22, 2023-02-28   See weekly On Treatment Notes in Epic for details.  He tolerated the radiation treatments relatively well with only mildly increased nocturia and modest fatigue.  The patient will receive a call in about one month from the radiation oncology department. He will continue follow up with his urologist, [name], as well.  ------------------------------------------------   Margaretmary Dys, MD [facility]  Radiation Oncology Direct Dial: [phone]  Fax: [phone] [email or web address]  Skype  LinkedIn

## 2023-03-06 NOTE — Progress Notes (Signed)
Patient was a RadOnc Consult on 12/06/22 for his biochemically recurrent prostate cancer.  Patient proceed with treatment recommendations of 7.5 weeks of salvage external beam therapy and had his final radiation treatment on 02/28/23.   Patient is scheduled for a post treatment nurse call on 04/16/23 and has ongoing urology follow up's at [facility].  Next appointment scheduled for 9/5.

## 2023-04-16 ENCOUNTER — Ambulatory Visit
Admission: RE | Admit: 2023-04-16 | Discharge: 2023-04-16 | Disposition: A | Discharge status: Home / Self Care | Payer: BC Managed Care – PPO | Source: Ambulatory Visit | Attending: Radiation Oncology | Admitting: Radiation Oncology

## 2023-04-16 NOTE — Progress Notes (Signed)
Radiation Oncology         [phone]) [phone] ________________________________  Name: Timothy [name] MRN: [id number]  Date of Service: 04/16/2023  DOB: 27-Nov-1970  Post Treatment Telephone Note  Diagnosis:  C61 Malignant neoplasm of prostate (as documented in provider EOT note)   Pre Treatment IPSS Score: 2 (as documented in the provider consult note)   The patient was available for call today.   Symptoms of fatigue have improved since completing therapy.  Symptoms of bladder changes have improved since completing therapy. Current symptoms include none, and medications for bladder symptoms include none.  Symptoms of bowel changes have improved since completing therapy. Current symptoms include none, and medications for bowel symptoms include none.     Post Treatment IPSS Score: IPSS Questionnaire (AUA-7): Over the past month.   1)  How often have you had a sensation of not emptying your bladder completely after you finish urinating?  0 - Not at all  2)  How often have you had to urinate again less than two hours after you finished urinating? 0 - Not at all  3)  How often have you found you stopped and started again several times when you urinated?  0 - Not at all  4) How difficult have you found it to postpone urination?  0 - Not at all  5) How often have you had a weak urinary stream?  0 - Not at all  6) How often have you had to push or strain to begin urination?  0 - Not at all  7) How many times did you most typically get up to urinate from the time you went to bed until the time you got up in the morning?  2 - 2 times  Total score:  2. Which indicates mild symptoms  0-7 mildly symptomatic   8-19 moderately symptomatic   20-35 severely symptomatic    Patient (has a scheduled follow up visit with his urologist, [name], on 06/2023 for ongoing surveillance. He was counseled that PSA levels will be drawn in the urology office, and was reassured that additional time is  expected to improve bowel and bladder symptoms. He was encouraged to call back with concerns or questions regarding radiation.   This concludes the interaction.  Ruel Favors, LPN

## 2023-04-18 ENCOUNTER — Other Ambulatory Visit: Payer: Self-pay | Admitting: Urology

## 2023-04-18 DIAGNOSIS — C61 Malignant neoplasm of prostate: Secondary | ICD-10-CM

## 2023-04-26 ENCOUNTER — Encounter: Payer: Self-pay | Admitting: *Deleted

## 2023-05-02 ENCOUNTER — Encounter: Payer: Self-pay | Admitting: *Deleted

## 2023-05-03 ENCOUNTER — Encounter: Payer: Self-pay | Admitting: *Deleted

## 2023-05-20 ENCOUNTER — Encounter: Payer: Self-pay | Admitting: *Deleted

## 2023-05-20 ENCOUNTER — Inpatient Hospital Stay: Payer: BC Managed Care – PPO | Attending: Adult Health | Admitting: *Deleted

## 2023-05-20 DIAGNOSIS — C61 Malignant neoplasm of prostate: Secondary | ICD-10-CM

## 2023-05-20 NOTE — Progress Notes (Signed)
SCP reviewed and completed.
